# Patient Record
Sex: Female | Born: 1955 | Race: White | Hispanic: No | Marital: Single | Smoking: Never smoker
Health system: Southern US, Community
[De-identification: ages and names within clinical notes are randomized; demographics above are authoritative.]

## PROBLEM LIST (undated history)

## (undated) DIAGNOSIS — T7840XA Allergy, unspecified, initial encounter: Secondary | ICD-10-CM

## (undated) DIAGNOSIS — F419 Anxiety disorder, unspecified: Secondary | ICD-10-CM

## (undated) DIAGNOSIS — K5790 Diverticulosis of intestine, part unspecified, without perforation or abscess without bleeding: Secondary | ICD-10-CM

## (undated) DIAGNOSIS — G43019 Migraine without aura, intractable, without status migrainosus: Secondary | ICD-10-CM

## (undated) DIAGNOSIS — H9190 Unspecified hearing loss, unspecified ear: Secondary | ICD-10-CM

## (undated) DIAGNOSIS — K449 Diaphragmatic hernia without obstruction or gangrene: Secondary | ICD-10-CM

## (undated) DIAGNOSIS — G43909 Migraine, unspecified, not intractable, without status migrainosus: Secondary | ICD-10-CM

## (undated) DIAGNOSIS — E785 Hyperlipidemia, unspecified: Secondary | ICD-10-CM

## (undated) DIAGNOSIS — K219 Gastro-esophageal reflux disease without esophagitis: Secondary | ICD-10-CM

## (undated) DIAGNOSIS — H9313 Tinnitus, bilateral: Secondary | ICD-10-CM

## (undated) DIAGNOSIS — K648 Other hemorrhoids: Secondary | ICD-10-CM

## (undated) HISTORY — DX: Other hemorrhoids: K64.8

## (undated) HISTORY — DX: Diverticulosis of intestine, part unspecified, without perforation or abscess without bleeding: K57.90

## (undated) HISTORY — DX: Hyperlipidemia, unspecified: E78.5

## (undated) HISTORY — DX: Diaphragmatic hernia without obstruction or gangrene: K44.9

## (undated) HISTORY — PX: ADENOIDECTOMY: SUR15

## (undated) HISTORY — PX: COLONOSCOPY: SHX174

## (undated) HISTORY — DX: Anxiety disorder, unspecified: F41.9

## (undated) HISTORY — PX: TONSILLECTOMY AND ADENOIDECTOMY: SUR1326

## (undated) HISTORY — DX: Gastro-esophageal reflux disease without esophagitis: K21.9

## (undated) HISTORY — DX: Unspecified hearing loss, unspecified ear: H91.90

## (undated) HISTORY — DX: Migraine, unspecified, not intractable, without status migrainosus: G43.909

## (undated) HISTORY — DX: Migraine without aura, intractable, without status migrainosus: G43.019

## (undated) HISTORY — PX: UPPER GASTROINTESTINAL ENDOSCOPY: SHX188

## (undated) HISTORY — DX: Allergy, unspecified, initial encounter: T78.40XA

## (undated) HISTORY — DX: Tinnitus, bilateral: H93.13

---

## 1999-04-20 ENCOUNTER — Other Ambulatory Visit: Admission: RE | Admit: 1999-04-20 | Discharge: 1999-04-20 | Payer: Self-pay | Admitting: *Deleted

## 2000-01-01 ENCOUNTER — Encounter: Admission: RE | Admit: 2000-01-01 | Discharge: 2000-01-01 | Payer: Self-pay | Admitting: Family Medicine

## 2000-01-01 ENCOUNTER — Encounter: Payer: Self-pay | Admitting: Family Medicine

## 2000-05-03 ENCOUNTER — Other Ambulatory Visit: Admission: RE | Admit: 2000-05-03 | Discharge: 2000-05-03 | Payer: Self-pay | Admitting: *Deleted

## 2000-06-22 ENCOUNTER — Ambulatory Visit (HOSPITAL_COMMUNITY): Admission: RE | Admit: 2000-06-22 | Discharge: 2000-06-22 | Payer: Self-pay | Admitting: *Deleted

## 2001-04-04 ENCOUNTER — Encounter: Admission: RE | Admit: 2001-04-04 | Discharge: 2001-04-04 | Payer: Self-pay | Admitting: *Deleted

## 2001-05-08 ENCOUNTER — Other Ambulatory Visit: Admission: RE | Admit: 2001-05-08 | Discharge: 2001-05-08 | Payer: Self-pay | Admitting: *Deleted

## 2002-05-20 ENCOUNTER — Other Ambulatory Visit: Admission: RE | Admit: 2002-05-20 | Discharge: 2002-05-20 | Payer: Self-pay | Admitting: *Deleted

## 2003-05-27 ENCOUNTER — Other Ambulatory Visit: Admission: RE | Admit: 2003-05-27 | Discharge: 2003-05-27 | Payer: Self-pay | Admitting: *Deleted

## 2004-01-11 ENCOUNTER — Encounter: Admission: RE | Admit: 2004-01-11 | Discharge: 2004-01-11 | Payer: Self-pay | Admitting: Family Medicine

## 2004-01-19 ENCOUNTER — Encounter: Admission: RE | Admit: 2004-01-19 | Discharge: 2004-01-19 | Payer: Self-pay | Admitting: *Deleted

## 2005-02-16 ENCOUNTER — Encounter: Admission: RE | Admit: 2005-02-16 | Discharge: 2005-02-16 | Payer: Self-pay | Admitting: *Deleted

## 2006-03-12 ENCOUNTER — Encounter: Admission: RE | Admit: 2006-03-12 | Discharge: 2006-03-12 | Payer: Self-pay | Admitting: Family Medicine

## 2007-04-02 ENCOUNTER — Encounter: Admission: RE | Admit: 2007-04-02 | Discharge: 2007-04-02 | Payer: Self-pay | Admitting: Family Medicine

## 2008-05-07 ENCOUNTER — Encounter: Admission: RE | Admit: 2008-05-07 | Discharge: 2008-05-07 | Payer: Self-pay | Admitting: Family Medicine

## 2010-03-20 ENCOUNTER — Encounter: Admission: RE | Admit: 2010-03-20 | Discharge: 2010-03-20 | Payer: Self-pay | Admitting: Internal Medicine

## 2010-03-21 ENCOUNTER — Encounter: Admission: RE | Admit: 2010-03-21 | Discharge: 2010-03-21 | Payer: Self-pay | Admitting: Internal Medicine

## 2010-03-23 ENCOUNTER — Encounter (INDEPENDENT_AMBULATORY_CARE_PROVIDER_SITE_OTHER): Payer: Self-pay | Admitting: *Deleted

## 2010-03-29 ENCOUNTER — Encounter: Admission: RE | Admit: 2010-03-29 | Discharge: 2010-03-29 | Payer: Self-pay | Admitting: Family Medicine

## 2010-04-07 ENCOUNTER — Encounter (INDEPENDENT_AMBULATORY_CARE_PROVIDER_SITE_OTHER): Payer: Self-pay | Admitting: *Deleted

## 2010-04-12 ENCOUNTER — Ambulatory Visit: Payer: Self-pay | Admitting: Internal Medicine

## 2010-04-21 ENCOUNTER — Telehealth (INDEPENDENT_AMBULATORY_CARE_PROVIDER_SITE_OTHER): Payer: Self-pay | Admitting: *Deleted

## 2010-04-25 ENCOUNTER — Ambulatory Visit: Payer: Self-pay | Admitting: Internal Medicine

## 2010-12-12 NOTE — Miscellaneous (Signed)
Summary: [facility]  Clinical Lists Changes  Medications: Added new medication of MIRALAX   POWD (POLYETHYLENE GLYCOL 3350) As per prep  instructions. - Signed Added new medication of REGLAN 10 MG  TABS (METOCLOPRAMIDE HCL) As per prep instructions. - Signed Added new medication of DULCOLAX 5 MG  TBEC (BISACODYL) Day before procedure take 2 at 3pm and 2 at 8pm. - Signed Rx of MIRALAX   POWD (POLYETHYLENE GLYCOL 3350) As per prep  instructions.;  #255gm x 0;  Signed;  Entered by: Ezra Sites RN;  Authorized by: Hart Carwin MD;  Method used: Electronically to [facility] [phone]*, [address], [place], [place]  [address], Ph: [phone], Fax: [phone] Rx of REGLAN 10 MG  TABS (METOCLOPRAMIDE HCL) As per prep instructions.;  #2 x 0;  Signed;  Entered by: Ezra Sites RN;  Authorized by: Hart Carwin MD;  Method used: Electronically to [facility]. [phone]*, [address], [place], [place]  [address], Ph: [phone], Fax: [phone] Rx of DULCOLAX 5 MG  TBEC (BISACODYL) Day before procedure take 2 at 3pm and 2 at 8pm.;  #4 x 0;  Signed;  Entered by: Ezra Sites RN;  Authorized by: Hart Carwin MD;  Method used: Electronically to [facility]. [phone]*, [address] [place], [place]  [address], Ph: [phone], Fax: [phone] Allergies: Added new allergy or adverse reaction of SULFA Observations: Added new observation of NKA: F (04/12/2010 7:45)    Prescriptions: DULCOLAX 5 MG  TBEC (BISACODYL) Day before procedure take 2 at 3pm and 2 at 8pm.  #4 x 0   Entered by:   Ezra Sites RN   Authorized by:   Hart Carwin MD   Signed by:   Ezra Sites RN on 04/12/2010   Method used:   Electronically to        [facility]. [phone]* (retail)       [address]       [place], [place]  [address]       Ph: [phone]       Fax: [phone]   RxID:   [id number] REGLAN 10 MG  TABS (METOCLOPRAMIDE HCL) As per prep instructions.  #2 x 0   Entered by:   Ezra Sites  RN   Authorized by:   Hart Carwin MD   Signed by:   Ezra Sites RN on 04/12/2010   Method used:   Electronically to        [facility]. [phone]* (retail)       [address]       [place], [place]  [address]       Ph: [phone]       Fax: [phone]   RxID:   [id number] MIRALAX   POWD (POLYETHYLENE GLYCOL 3350) As per prep  instructions.  #255gm x 0   Entered by:   Ezra Sites RN   Authorized by:   Hart Carwin MD   Signed by:   Ezra Sites RN on 04/12/2010   Method used:   Electronically to        [facility]. [phone]* (retail)       [address]       [place], [place]  [address]       Ph: [phone]       Fax: [phone]   RxID:   [id number]

## 2010-12-12 NOTE — Progress Notes (Signed)
Summary: prep ?'s  Phone Note Call from Patient Call back at [phone]   Caller: Patient Call For: Dr. Juanda Chance Reason for Call: Talk to Nurse Summary of Call: prep ?'s Initial call taken by: Vallarie Mare,  April 21, 2010 11:49 AM  Follow-up for Phone Call        Called pt and left message we had returned call.Ulis Rias RN  April 21, 2010 1:06 PM phone call complete pt.'s question answered by Almyra Brace Follow-up by: Wyona Almas RN,  April 21, 2010 3:30 PM

## 2010-12-12 NOTE — Letter (Signed)
Summary: Previsit letter  [facility]  [address] [place], [place] [address]   Phone: [phone]  Fax: [phone]       03/23/2010 MRN: [id number]  Katherine Crosby [address] [place], [place]  [address]  Dear Ms. [name],  Welcome to the Gastroenterology Division at [facility].    You are scheduled to see a nurse for your pre-procedure visit on 04-12-10 at 8am on the 3rd floor at [facility], [address]  We ask that you try to arrive at our office 15 minutes prior to your appointment time to allow for check-in.  Your nurse visit will consist of discussing your medical and surgical history, your immediate family medical history, and your medications.    Please bring a complete list of all your medications or, if you prefer, bring the medication bottles and we will list them.  We will need to be aware of both prescribed and over the counter drugs.  We will need to know exact dosage information as well.  If you are on blood thinners (Coumadin, Plavix, Aggrenox, Ticlid, etc.) please call our office today/prior to your appointment, as we need to consult with your physician about holding your medication.   Please be prepared to read and sign documents such as consent forms, a financial agreement, and acknowledgement forms.  If necessary, and with your consent, a friend or relative is welcome to sit-in on the nurse visit with you.  Please bring your insurance card so that we may make a copy of it.  If your insurance requires a referral to see a specialist, please bring your referral form from your primary care physician.  No co-pay is required for this nurse visit.     If you cannot keep your appointment, please call [phone] to cancel or reschedule prior to your appointment date.  This allows [place] the opportunity to schedule an appointment for another patient in need of care.    Thank you for choosing [facility] for your medical needs.  We  appreciate the opportunity to care for you.  Please visit [place] at our website  to learn more about our practice.                     Sincerely.                                                                                                                   The Gastroenterology Division

## 2010-12-12 NOTE — Letter (Signed)
Summary: [facility] Instructions  [facility]  [address] [place], [place] [address]   Phone: [phone]  Fax: [phone]       Katherine Crosby    [birth year]    MRN: [id number]       Procedure Day /Date: Tuesday 04/25/2010     Arrival Time:  9:00 am     Procedure Time: 10:00 am     Location of Procedure:                    _x _  [facility] (4th Floor)    PREPARATION FOR COLONOSCOPY WITH MIRALAX  Starting 5 days prior to your procedure Thursday 6/9 do not eat nuts, seeds, popcorn, corn, beans, peas,  salads, or any raw vegetables.  Do not take any fiber supplements (e.g. Metamucil, Citrucel, and Benefiber). ____________________________________________________________________________________________________   THE DAY BEFORE YOUR PROCEDURE         DATE: Monday 6/13  1   Drink clear liquids the entire day-NO SOLID FOOD  2   Do not drink anything colored red or purple.  Avoid juices with pulp.  No orange juice.  3   Drink at least 64 oz. (8 glasses) of fluid/clear liquids during the day to prevent dehydration and help the prep Mordecai efficiently.  CLEAR LIQUIDS INCLUDE: Water Jello Ice Popsicles Tea (sugar ok, no milk/cream) Powdered fruit flavored drinks Coffee (sugar ok, no milk/cream) Gatorade Juice: apple, white grape, white cranberry  Lemonade Clear bullion, consomm, broth Carbonated beverages (any kind) Strained chicken noodle soup Hard Candy  4   Mix the entire bottle of Miralax with 64 oz. of Gatorade/Powerade in the morning and put in the refrigerator to chill.  5   At 3:00 pm take 2 Dulcolax/Bisacodyl tablets.  6   At 4:30 pm take one Reglan/Metoclopramide tablet.  7  Starting at 5:00 pm drink one 8 oz glass of the Miralax mixture every 15-20 minutes until you have finished drinking the entire 64 oz.  You should finish drinking prep around 7:30 or 8:00 pm.  8   If you are nauseated, you may take the 2nd Reglan/Metoclopramide  tablet at 6:30 pm.        9    At 8:00 pm take 2 more DULCOLAX/Bisacodyl tablets.     THE DAY OF YOUR PROCEDURE      DATE:  Tuesday 6/14  You may drink clear liquids until 8:00 am  (2 HOURS BEFORE PROCEDURE).   MEDICATION INSTRUCTIONS  Unless otherwise instructed, you should take regular prescription medications with a small sip of water as early as possible the morning of your procedure.          OTHER INSTRUCTIONS  You will need a responsible adult at least 53 years of age to accompany you and drive you home.   This person must remain in the waiting room during your procedure.  Wear loose fitting clothing that is easily removed.  Leave jewelry and other valuables at home.  However, you may wish to bring a book to read or an iPod/MP3 player to listen to music as you wait for your procedure to start.  Remove all body piercing jewelry and leave at home.  Total time from sign-in until discharge is approximately 2-3 hours.  You should go home directly after your procedure and rest.  You can resume normal activities the day after your procedure.  The day of your procedure you should not:   Drive  Make legal decisions   Operate machinery   Drink alcohol   Return to [place]  You will receive specific instructions about eating, activities and medications before you leave.   The above instructions have been reviewed and explained to me by   Ezra Sites RN  April 12, 2010 8:05 AM     I fully understand and can verbalize these instructions _____________________________ Date _______

## 2010-12-12 NOTE — Procedures (Signed)
Summary: Colonoscopy  Patient: Yvonne Diefenderfer Note: All result statuses are Final unless otherwise noted.  Tests: (1) Colonoscopy (COL)   COL Colonoscopy           DONE     [facility]     [address]     [place], [place]  [address]           COLONOSCOPY PROCEDURE REPORT           PATIENT:  Rohm, Reighlynn  MR#:  [id number]     BIRTHDATE:  Jul 22, 1956, 53 yrs. old  GENDER:  female     ENDOSCOPIST:  Hedwig Morton. Juanda Chance, MD     REF. BY:  Merri Brunette, M.D.     PROCEDURE DATE:  04/25/2010     PROCEDURE:  Colonoscopy [address]     ASA CLASS:  Class I     INDICATIONS:  Routine Risk Screening     MEDICATIONS:   Versed 7 mg, Fentanyl 75 mcg           DESCRIPTION OF PROCEDURE:   After the risks benefits and     alternatives of the procedure were thoroughly explained, informed     consent was obtained.  Digital rectal exam was performed and     revealed no rectal masses.   The LB [id number]-[id number] endoscope     was introduced through the anus and advanced to the cecum, which     was identified by both the appendix and ileocecal valve, without     limitations.  The quality of the prep was good, using MiraLax.     The instrument was then slowly withdrawn as the colon was fully     examined.     <<PROCEDUREIMAGES>>           FINDINGS:  No polyps or cancers were seen (see image1, image2,     image3, and image4).   Retroflexed views in the rectum revealed no     abnormalities.    The scope was then withdrawn from the patient     and the procedure completed.           COMPLICATIONS:  None     ENDOSCOPIC IMPRESSION:     1) No polyps or cancers     2) Normal colonoscopy     RECOMMENDATIONS:     1) high fiber diet     REPEAT EXAM:  In 10 year(s) for.           ______________________________     Hedwig Morton. Juanda Chance, MD           CC:           n.     eSIGNED:   Hedwig Morton. Starlee Corralejo at 04/25/2010 10:29 AM           Ripple, Elease Hashimoto, [phone]  Note: An exclamation mark (!) indicates a result  that was not dispersed into the flowsheet. Document Creation Date: 04/25/2010 10:30 AM _______________________________________________________________________  (1) Order result status: Final Collection or observation date-time: 04/25/2010 10:24 Requested date-time:  Receipt date-time:  Reported date-time:  Referring Physician:   Ordering Physician: Lina Sar [phone]) Specimen Source:  Source: Launa Grill Order Number: [id number] Lab site:   Appended Document: Colonoscopy    Clinical Lists Changes  Observations: Added new observation of COLONNXTDUE: 04/2020 (04/25/2010 12:53)

## 2012-06-12 ENCOUNTER — Other Ambulatory Visit: Payer: Self-pay | Admitting: Obstetrics and Gynecology

## 2012-06-12 DIAGNOSIS — Z1231 Encounter for screening mammogram for malignant neoplasm of breast: Secondary | ICD-10-CM

## 2012-06-26 ENCOUNTER — Ambulatory Visit
Admission: RE | Admit: 2012-06-26 | Discharge: 2012-06-26 | Disposition: A | Discharge status: Home / Self Care | Payer: BC Managed Care – PPO | Source: Ambulatory Visit | Attending: Obstetrics and Gynecology | Admitting: Obstetrics and Gynecology

## 2012-06-26 DIAGNOSIS — Z1231 Encounter for screening mammogram for malignant neoplasm of breast: Secondary | ICD-10-CM

## 2012-12-16 ENCOUNTER — Ambulatory Visit
Admission: RE | Admit: 2012-12-16 | Discharge: 2012-12-16 | Disposition: A | Discharge status: Home / Self Care | Payer: BC Managed Care – PPO | Source: Ambulatory Visit | Attending: Family Medicine | Admitting: Family Medicine

## 2012-12-16 ENCOUNTER — Other Ambulatory Visit: Payer: Self-pay | Admitting: Family Medicine

## 2012-12-16 DIAGNOSIS — S63509A Unspecified sprain of unspecified wrist, initial encounter: Secondary | ICD-10-CM

## 2012-12-16 DIAGNOSIS — W19XXXA Unspecified fall, initial encounter: Secondary | ICD-10-CM

## 2012-12-16 DIAGNOSIS — S93409A Sprain of unspecified ligament of unspecified ankle, initial encounter: Secondary | ICD-10-CM

## 2013-07-24 ENCOUNTER — Other Ambulatory Visit: Payer: Self-pay

## 2013-07-24 DIAGNOSIS — Z1231 Encounter for screening mammogram for malignant neoplasm of breast: Secondary | ICD-10-CM

## 2013-07-27 ENCOUNTER — Ambulatory Visit: Payer: BC Managed Care – PPO

## 2013-08-07 ENCOUNTER — Ambulatory Visit
Admission: RE | Admit: 2013-08-07 | Discharge: 2013-08-07 | Disposition: A | Discharge status: Home / Self Care | Payer: BC Managed Care – PPO | Source: Ambulatory Visit

## 2013-08-07 DIAGNOSIS — Z1231 Encounter for screening mammogram for malignant neoplasm of breast: Secondary | ICD-10-CM

## 2014-07-26 ENCOUNTER — Other Ambulatory Visit: Payer: Self-pay

## 2014-07-26 DIAGNOSIS — Z1231 Encounter for screening mammogram for malignant neoplasm of breast: Secondary | ICD-10-CM

## 2014-08-09 ENCOUNTER — Ambulatory Visit
Admission: RE | Admit: 2014-08-09 | Discharge: 2014-08-09 | Disposition: A | Discharge status: Home / Self Care | Payer: BC Managed Care – PPO | Source: Ambulatory Visit

## 2014-08-09 DIAGNOSIS — Z1231 Encounter for screening mammogram for malignant neoplasm of breast: Secondary | ICD-10-CM

## 2015-03-17 ENCOUNTER — Encounter: Payer: Self-pay | Admitting: Internal Medicine

## 2015-11-21 ENCOUNTER — Other Ambulatory Visit: Payer: Self-pay

## 2015-11-21 DIAGNOSIS — Z1231 Encounter for screening mammogram for malignant neoplasm of breast: Secondary | ICD-10-CM

## 2015-12-08 ENCOUNTER — Ambulatory Visit: Payer: BC Managed Care – PPO

## 2015-12-19 ENCOUNTER — Ambulatory Visit
Admission: RE | Admit: 2015-12-19 | Discharge: 2015-12-19 | Disposition: A | Discharge status: Home / Self Care | Payer: BC Managed Care – PPO | Source: Ambulatory Visit

## 2015-12-19 DIAGNOSIS — Z1231 Encounter for screening mammogram for malignant neoplasm of breast: Secondary | ICD-10-CM

## 2017-03-25 ENCOUNTER — Other Ambulatory Visit: Payer: Self-pay | Admitting: Family Medicine

## 2017-03-25 DIAGNOSIS — Z1231 Encounter for screening mammogram for malignant neoplasm of breast: Secondary | ICD-10-CM

## 2017-03-26 ENCOUNTER — Encounter: Payer: Self-pay | Admitting: Gastroenterology

## 2017-04-03 ENCOUNTER — Ambulatory Visit
Admission: RE | Admit: 2017-04-03 | Discharge: 2017-04-03 | Disposition: A | Discharge status: Home / Self Care | Payer: BC Managed Care – PPO | Source: Ambulatory Visit | Attending: Family Medicine | Admitting: Family Medicine

## 2017-04-03 DIAGNOSIS — Z1231 Encounter for screening mammogram for malignant neoplasm of breast: Secondary | ICD-10-CM

## 2017-04-30 ENCOUNTER — Encounter: Payer: Self-pay | Admitting: Gastroenterology

## 2017-04-30 ENCOUNTER — Ambulatory Visit (INDEPENDENT_AMBULATORY_CARE_PROVIDER_SITE_OTHER): Payer: BC Managed Care – PPO | Admitting: Gastroenterology

## 2017-04-30 VITALS — BP 140/88 | HR 76 | Ht 67.0 in | Wt 182.6 lb

## 2017-04-30 DIAGNOSIS — K219 Gastro-esophageal reflux disease without esophagitis: Secondary | ICD-10-CM | POA: Diagnosis not present

## 2017-04-30 DIAGNOSIS — R1310 Dysphagia, unspecified: Secondary | ICD-10-CM | POA: Diagnosis not present

## 2017-04-30 NOTE — Progress Notes (Signed)
HPI :  61 y/o female with a history of migraine headaches and reflux, here for a new patient evaluation for reflux.   She reports she has some burning chest pressure, and belching which can bother her. Symptoms are intermittent.  No clear food precipitants, or predictable triggers related to these symptoms. She reports longstanding reflux symptoms for several years. She previously used Zantac OTC periodically which helped, but then symptoms started to progress. She then began omeprazole low dose 20mg  using it PRN which worked initially, and now taking it daily at 40mg  once daily. She has been on 40mg  once per day for a few months. She is having breakthrough despite this, hard to clarify how often. She can feel symptoms both during daytime and at night. She has a sense of periodic dysphagia, about 1/4 of the time she is eating. No liquid dysphagia. She has some occasional nausea, no vomiting. She has some occasional RLQ pain which she states is chronic, and taken away with dietary changes, eating high fiber diet. She thinks maybe has gained a few pounds, but nothing dramatic.   No FH of esophageal or colon cancer.   Colonoscopy 04/25/2010 - normal  Past Medical History:  Diagnosis Date  . GERD (gastroesophageal reflux disease)   . Migraine headache      Past Surgical History:  Procedure Laterality Date  . TONSILLECTOMY AND ADENOIDECTOMY       Family History  Problem Relation Age of Onset  . Stroke Mother   . Dementia Father    Social History  Substance Use Topics  . Smoking status: Never Smoker  . Smokeless tobacco: Never Used  . Alcohol use Yes   Current Outpatient Prescriptions  Medication Sig Dispense Refill  . calcium carbonate (OS-CAL) 600 MG TABS tablet Take 1 tablet by mouth daily.    . cholecalciferol (VITAMIN D) 1000 units tablet Take 1 tablet by mouth daily.    . Omega-3 Fatty Acids (FISH OIL OMEGA-3 PO) Take 1 capsule by mouth daily.    Marland Kitchen omeprazole (PRILOSEC) 40 MG  capsule Take 1 capsule by mouth daily.  5  . rizatriptan (MAXALT-MLT) 10 MG disintegrating tablet Take 10 mg by mouth as needed.     No current facility-administered medications for this visit.    Allergies  Allergen Reactions  . Metronidazole Nausea And Vomiting  . Cefuroxime Axetil Nausea And Vomiting  . Sulfonamide Derivatives     REACTION: rash     Review of Systems: All systems reviewed and negative except where noted in HPI.    Mm Screening Breast Tomo Bilateral  Result Date: 04/03/2017 CLINICAL DATA:  Screening. EXAM: 2D DIGITAL SCREENING BILATERAL MAMMOGRAM WITH CAD AND ADJUNCT TOMO COMPARISON:  Previous exam(s). ACR Breast Density Category b: There are scattered areas of fibroglandular density. FINDINGS: There are no findings suspicious for malignancy. Images were processed with CAD. IMPRESSION: No mammographic evidence of malignancy. A result letter of this screening mammogram will be mailed directly to the patient. RECOMMENDATION: Screening mammogram in one year. (Code:SM-B-01Y) BI-RADS CATEGORY  1: Negative. Electronically Signed   By: Evangeline Dakin M.D.   On: 04/03/2017 14:48   No results found for: WBC, HGB, HCT, MCV, PLT  No results found for: CREATININE, BUN, NA, K, CL, CO2  No results found for: ALT, AST, GGT, ALKPHOS, BILITOT    Physical Exam: BP 140/88   Pulse 76   Ht 5\' 7"  (1.702 m)   Wt 182 lb 9.6 oz (82.8 kg)   BMI  28.60 kg/m  Constitutional: Pleasant,well-developed, female in no acute distress. HEENT: Normocephalic and atraumatic. Conjunctivae are normal. No scleral icterus. Neck supple.  Cardiovascular: Normal rate, regular rhythm.  Pulmonary/chest: Effort normal and breath sounds normal. No wheezing, rales or rhonchi. Abdominal: Soft, nondistended, nontender.  There are no masses palpable. No hepatomegaly. Extremities: no edema Lymphadenopathy: No cervical adenopathy noted. Neurological: Alert and oriented to person place and time. Skin: Skin  is warm and dry. No rashes noted. Psychiatric: Normal mood and affect. Behavior is normal.   ASSESSMENT AND PLAN: 61 year old female with history as above here for new patient evaluation for reflux.  Long-standing symptoms, initially well controlled with Zantac now with escalating doses of PPI, with continued breakthrough of reflux symptoms. We discussed natural history of reflux, potential complications to include Barrett's esophagus and peptic stricture. She's having some periodic dysphagia which is relatively new. Recommend an upper endoscopy to further evaluate and potentially perform dilation for symptoms of dysphagia. I discussed risks and benefits of endoscopy and anesthesia with her and she wished to proceed. Given her breakthrough reflux symptoms that are bothering her currently, recommend increasing omeprazole to 40 mg twice daily until the procedure. We did discuss long-term potential risks of PPIs - recommend low-dose daily dose needed to control symptoms long-term, for now she will continue high-dose given she returns relatively symptomatic.  All questions answered.  Phelps Cellar, MD [facility] Pager [phone]

## 2017-04-30 NOTE — Patient Instructions (Signed)
If you are age 59 or older, your body mass index should be between 23-30. Your Body mass index is 28.6 kg/m. If this is out of the aforementioned range listed, please consider follow up with your Primary Care Provider.  If you are age 59 or younger, your body mass index should be between 19-25. Your Body mass index is 28.6 kg/m. If this is out of the aformentioned range listed, please consider follow up with your Primary Care Provider.   You have been scheduled for an endoscopy. Please follow written instructions given to you at your visit today. If you use inhalers (even only as needed), please bring them with you on the day of your procedure. Your physician has requested that you go to www.startemmi.com and enter the access code given to you at your visit today. This web site gives a general overview about your procedure. However, you should still follow specific instructions given to you by our office regarding your preparation for the procedure.  Thank you.

## 2017-05-01 ENCOUNTER — Encounter: Payer: Self-pay | Admitting: Gastroenterology

## 2017-05-12 DIAGNOSIS — S82401A Unspecified fracture of shaft of right fibula, initial encounter for closed fracture: Secondary | ICD-10-CM

## 2017-05-12 HISTORY — DX: Unspecified fracture of shaft of right fibula, initial encounter for closed fracture: S82.401A

## 2017-05-27 ENCOUNTER — Telehealth: Payer: Self-pay | Admitting: Gastroenterology

## 2017-05-27 NOTE — Telephone Encounter (Signed)
Called patient to let her know that it should be okay to proceed with EGD on 7/27. I did let her know that if she worsens and will need more assistance then to call and we can reschedule.

## 2017-05-27 NOTE — Telephone Encounter (Signed)
Patient broke her right lower leg, is in a walking boot. She also sprained left lower leg. Currently she is in a wheelchair, is able with assistance to pivot transfer. She is scheduled for EGD on 7/27 at [facility] and wants to know if this will interfere. Please advise.

## 2017-05-27 NOTE — Telephone Encounter (Signed)
If she can bear weight on it to pivot and only minimal assistance I think okay to proceed. Hopefully she is improved a bit by 7/27. If she is completely dependant on others assistance to move then we would have to reschedule. Thanks

## 2017-05-28 ENCOUNTER — Encounter: Payer: Self-pay | Admitting: Gastroenterology

## 2017-06-05 HISTORY — PX: OTHER SURGICAL HISTORY: SHX169

## 2017-06-07 ENCOUNTER — Encounter: Payer: BC Managed Care – PPO | Admitting: Gastroenterology

## 2017-07-11 ENCOUNTER — Encounter: Payer: Self-pay | Admitting: Gastroenterology

## 2017-07-11 ENCOUNTER — Ambulatory Visit (AMBULATORY_SURGERY_CENTER): Payer: BC Managed Care – PPO | Admitting: Gastroenterology

## 2017-07-11 VITALS — BP 138/68 | HR 66 | Temp 98.6°F | Resp 15 | Ht 67.0 in | Wt 182.0 lb

## 2017-07-11 DIAGNOSIS — K259 Gastric ulcer, unspecified as acute or chronic, without hemorrhage or perforation: Secondary | ICD-10-CM | POA: Diagnosis not present

## 2017-07-11 DIAGNOSIS — K200 Eosinophilic esophagitis: Secondary | ICD-10-CM

## 2017-07-11 DIAGNOSIS — R1310 Dysphagia, unspecified: Secondary | ICD-10-CM | POA: Diagnosis not present

## 2017-07-11 MED ORDER — SODIUM CHLORIDE 0.9 % IV SOLN: 500.0000 mL | INTRAVENOUS | Status: DC

## 2017-07-11 MED ORDER — OMEPRAZOLE 40 MG PO CPDR: 40.0000 mg | DELAYED_RELEASE_CAPSULE | Freq: Two times a day (BID) | ORAL | 5 refills | Status: DC

## 2017-07-11 MED ORDER — OMEPRAZOLE 40 MG PO CPDR: 40.0000 mg | DELAYED_RELEASE_CAPSULE | Freq: Every day | ORAL | 3 refills | Status: DC

## 2017-07-11 NOTE — Progress Notes (Signed)
Called to room to assist during endoscopic procedure.  Patient ID and intended procedure confirmed with present staff. Received instructions for my participation in the procedure from the performing physician.  

## 2017-07-11 NOTE — Patient Instructions (Signed)
YOU HAD AN ENDOSCOPIC PROCEDURE TODAY AT [facility]:   Refer to the procedure report that was given to you for any specific questions about what was found during the examination.  If the procedure report does not answer your questions, please call your gastroenterologist to clarify.  If you requested that your care partner not be given the details of your procedure findings, then the procedure report has been included in a sealed envelope for you to review at your convenience later.  YOU SHOULD EXPECT: Some feelings of bloating in the abdomen. Passage of more gas than usual.  Walking can help get rid of the air that was put into your GI tract during the procedure and reduce the bloating. If you had a lower endoscopy (such as a colonoscopy or flexible sigmoidoscopy) you may notice spotting of blood in your stool or on the toilet paper. If you underwent a bowel prep for your procedure, you may not have a normal bowel movement for a few days.  Please Note:  You might notice some irritation and congestion in your nose or some drainage.  This is from the oxygen used during your procedure.  There is no need for concern and it should clear up in a day or so.  SYMPTOMS TO REPORT IMMEDIATELY:   Following upper endoscopy (EGD)  Vomiting of blood or coffee ground material  New chest pain or pain under the shoulder blades  Painful or persistently difficult swallowing  New shortness of breath  Fever of 100F or higher  Black, tarry-looking stools  For urgent or emergent issues, a gastroenterologist can be reached at any hour by calling [phone].   DIET: Follow Post Esophageal Dilatation Diet as ordered.  Drink plenty of fluids but you should avoid alcoholic beverages for 24 hours.  ACTIVITY:  You should plan to take it easy for the rest of today and you should NOT DRIVE or use heavy machinery until tomorrow (because of the sedation medicines used during the test).    FOLLOW  UP: Our staff will call the number listed on your records the next business day following your procedure to check on you and address any questions or concerns that you may have regarding the information given to you following your procedure. If we do not reach you, we will leave a message.  However, if you are feeling well and you are not experiencing any problems, there is no need to return our call.  We will assume that you have returned to your regular daily activities without incident.  If any biopsies were taken you will be contacted by phone or by letter within the next 1-3 weeks.  Please call us at [phone] if you have not heard about the biopsies in 3 weeks.   Post Esophageal Dilation Diet (handout given) Esophagitis and Stricture (handout given) Await for biopsy results   SIGNATURES/CONFIDENTIALITY: You and/or your care partner have signed paperwork which will be entered into your electronic medical record.  These signatures attest to the fact that that the information above on your After Visit Summary has been reviewed and is understood.  Full responsibility of the confidentiality of this discharge information lies with you and/or your care-partner.

## 2017-07-11 NOTE — Progress Notes (Signed)
To PACU, VSS. Report to [email or web address] 

## 2017-07-11 NOTE — Op Note (Signed)
[facility] Patient Name: Katherine Crosby Procedure Date: 07/11/2017 10:57 AM MRN: [id number] Endoscopist: Remo Lipps P. Armbruster MD, MD Age: 61 Referring MD:  Date of Birth: 04-24-1956 Gender: Female Account #: [id number] Procedure:                Upper GI endoscopy Indications:              Dysphagia, Heartburn despite omeprazole 20mg  twice                            daily Medicines:                Monitored Anesthesia Care Procedure:                Pre-Anesthesia Assessment:                           - Prior to the procedure, a History and Physical                            was performed, and patient medications and                            allergies were reviewed. The patient's tolerance of                            previous anesthesia was also reviewed. The risks                            and benefits of the procedure and the sedation                            options and risks were discussed with the patient.                            All questions were answered, and informed consent                            was obtained. Prior Anticoagulants: The patient has                            taken no previous anticoagulant or antiplatelet                            agents. ASA Grade Assessment: II - A patient with                            mild systemic disease. After reviewing the risks                            and benefits, the patient was deemed in                            satisfactory condition to undergo the procedure.  After obtaining informed consent, the endoscope was                            passed under direct vision. Throughout the                            procedure, the patient's blood pressure, pulse, and                            oxygen saturations were monitored continuously. The                            Model GIF-HQ190 [id number]) scope was introduced                            through the mouth, and advanced to the  second part                            of duodenum. The upper GI endoscopy was                            accomplished without difficulty. The patient                            tolerated the procedure well. Scope In: Scope Out: Findings:                 Esophagogastric landmarks were identified: the                            Z-line was found at 34 cm, the gastroesophageal                            junction was found at 34 cm and the upper extent of                            the gastric folds was found at 36 cm from the                            incisors.                           A 2 cm hiatal hernia was present.                           The exam of the esophagus was otherwise normal. No                            obvious inflammatory changes noted.                           A guidewire was placed and the scope was withdrawn.                            Empiric  dilation was performed in the entire                            esophagus with a Savary dilator with mild                            resistance at 17 mm and 18 mm. Relook endoscopy                            showed a small appropriate mucosal wrent at the UES                            / upper esophagus. Biopsies were taken with a cold                            forceps in the upper third of the esophagus, in the                            middle third of the esophagus and in the lower                            third of the esophagus for histology to rule out                            eosinophilic esophagitis.                           Patchy inflammation characterized by adherent old                            blood was found in the gastric antrum. Biopsies                            were taken from the antrum, incisura, and body with                            a cold forceps for Helicobacter pylori testing.                           The exam of the stomach was otherwise normal.                           The duodenal bulb  and second portion of the                            duodenum were normal. Complications:            No immediate complications. Estimated blood loss:                            Minimal. Estimated Blood Loss:     Estimated blood loss was minimal. Impression:               - Esophagogastric landmarks identified.                           -  2 cm hiatal hernia.                           - Normal esophagus otherwise - empirically dilated                            to 33mm with small wrent at UES / upper esophagus,                            suspect a subtle stenosis was in this area. Also                            took biopsies of the esophagus to rule out                            eosinophilic esophagitis.                           - Gastritis. Biopsied.                           - Normal duodenal bulb and second portion of the                            duodenum. Recommendation:           - Patient has a contact number available for                            emergencies. The signs and symptoms of potential                            delayed complications were discussed with the                            patient. Return to normal activities tomorrow.                            Written discharge instructions were provided to the                            patient.                           - Resume previous diet.                           - Continue present medications.                           - Increase omeprazole to 40mg  twice daily - take                            1/2 hr prior to a meal                           -  Await pathology results and course following                            dilation / increasing omeprazole Remo Lipps P. Armbruster MD, MD 07/11/2017 11:22:15 AM This report has been signed electronically.

## 2017-07-12 ENCOUNTER — Telehealth: Payer: Self-pay | Admitting: *Deleted

## 2017-07-12 NOTE — Telephone Encounter (Signed)
  Follow up Call-  Call back number 07/11/2017  Post procedure Call Back phone  # [phone]  Permission to leave phone message Yes  Some recent data might be hidden     Patient questions:  Do you have a fever, pain , or abdominal swelling? No. Pain Score  0 *  Have you tolerated food without any problems? Yes.    Have you been able to return to your normal activities? Yes.    Do you have any questions about your discharge instructions: Diet   No. Medications  No. Follow up visit  No.  Do you have questions or concerns about your Care? No.  Actions: * If pain score is 4 or above: No action needed, pain <4.

## 2017-07-18 ENCOUNTER — Encounter: Payer: Self-pay | Admitting: Gastroenterology

## 2018-03-04 ENCOUNTER — Ambulatory Visit: Payer: BC Managed Care – PPO | Admitting: Physician Assistant

## 2018-03-12 ENCOUNTER — Ambulatory Visit: Payer: BC Managed Care – PPO | Admitting: Nurse Practitioner

## 2018-03-12 ENCOUNTER — Encounter: Payer: Self-pay | Admitting: Nurse Practitioner

## 2018-03-12 VITALS — BP 140/80 | HR 78 | Ht 67.0 in | Wt 179.2 lb

## 2018-03-12 DIAGNOSIS — K219 Gastro-esophageal reflux disease without esophagitis: Secondary | ICD-10-CM | POA: Diagnosis not present

## 2018-03-12 DIAGNOSIS — R1310 Dysphagia, unspecified: Secondary | ICD-10-CM | POA: Diagnosis not present

## 2018-03-12 MED ORDER — OMEPRAZOLE 40 MG PO CPDR: 40.0000 mg | DELAYED_RELEASE_CAPSULE | ORAL | 6 refills | Status: DC

## 2018-03-12 MED ORDER — RANITIDINE HCL 300 MG PO TABS: 300.0000 mg | ORAL_TABLET | Freq: Every day | ORAL | 3 refills | Status: DC

## 2018-03-12 NOTE — Patient Instructions (Signed)
If you are age 60 or older, your body mass index should be between 23-30. Your Body mass index is 28.07 kg/m. If this is out of the aforementioned range listed, please consider follow up with your Primary Care Provider.  If you are age 21 or younger, your body mass index should be between 19-25. Your Body mass index is 28.07 kg/m. If this is out of the aformentioned range listed, please consider follow up with your Primary Care Provider.   We have sent the following medications to your pharmacy for you to pick up at your convenience: Omeprazole Zantac  Advised patient to eat small bites, chew well with liquids in between bites to avoid food impaction.  You have been given GERD literature.  Consider using a wedge pillow.  Call in 3-4 weeks with an update.  Thank you for choosing me and [facility].   Tye Savoy, NP

## 2018-03-12 NOTE — Progress Notes (Signed)
      IMPRESSION and PLAN:    #1. 62 year female with GERD / recurrent dysphagia.  Less than one year relief after empirical esophageal dilation last August -Anti-reflux measures discussed, recommended wedge pillow -Resume Omeprazole 40 mg q am 30 min prior to breakfast -Add zantac 300 mg at HS -call me in 4 weeks with update.  -No episodes of dysphagia in a couple of weeks. If symptoms recur then consider esophageal manometry as repeat empirical dilation probable wouldn't be helpful .   #2. Recent intermittent epigastric pain, non-radiating and often post-prandial. Pain now resolved      #3. Colon cancer screening, she is up to date on colonoscopy. Last one 2011.   HPI:    Chief Complaint:  GERD / dysphagia.    Patient is a 72 female known to Dr. Havery Moros for GERD. She underwent EGD with empirical dilation last August.and subsequently had resolution of GERD sx and dysphagia. In January she made some dietary changes, reduced fat intake and sx came back triple fold. Restarted Omeprazole but sx seemed to be worse on  days she took it. Tried Vinegar but it didn't help. The dysphagia is to solids only, but she hasn't had any problems swallowing in a couple of weeks. Her throat has been sore but she has a lot of sinus drainage and attributes it to allergies. No odynophagia. She just started Claritin yesterday. She still gets heartburn several times a week. She was recently having intermittent non-radiating epigastric discomfort , worse with meals. No pain in two weeks now. She has no associated nausea / vomiting. No NSAID use. BMs normal. She is slowly losing weight intentionally    Review of systems:     No chest pain, SOB or urinary sx  Past Medical History:  Diagnosis Date  . Allergy   . GERD (gastroesophageal reflux disease)   . Hyperlipidemia   . Migraine headache     Patient's surgical history, family medical history, social history, medications and allergies were all reviewed  in Epic    Physical Exam:     BP 140/80   Pulse 78   Ht 5\' 7"  (1.702 m)   Wt 179 lb 3.2 oz (81.3 kg)   SpO2 96%   BMI 28.07 kg/m   GENERAL: well developed white female in in NAD PSYCH: :Pleasant, cooperative, normal affect EENT:  conjunctiva pink, mucous membranes moist, neck supple without masses CARDIAC:  RRR, no murmur heard, no peripheral edema PULM: Normal respiratory effort, lungs CTA bilaterally, no wheezing ABDOMEN:  Nondistended, soft, nontender. No obvious masses, no hepatomegaly,  normal bowel sounds SKIN:  turgor, no lesions seen Musculoskeletal:  Normal muscle tone, normal strength NEURO: Alert and oriented x 3, no focal neurologic deficits   Tye Savoy , NP 03/12/2018, 8:51 AM

## 2018-03-13 ENCOUNTER — Ambulatory Visit: Payer: BC Managed Care – PPO | Admitting: Physician Assistant

## 2018-03-16 ENCOUNTER — Encounter: Payer: Self-pay | Admitting: Nurse Practitioner

## 2018-03-17 NOTE — Progress Notes (Signed)
Agree with assessment and plan as outlined.  

## 2018-04-14 ENCOUNTER — Other Ambulatory Visit: Payer: Self-pay | Admitting: Family Medicine

## 2018-04-14 DIAGNOSIS — Z1231 Encounter for screening mammogram for malignant neoplasm of breast: Secondary | ICD-10-CM

## 2018-04-22 ENCOUNTER — Ambulatory Visit: Payer: BC Managed Care – PPO | Admitting: Neurology

## 2018-04-22 ENCOUNTER — Encounter: Payer: Self-pay | Admitting: Neurology

## 2018-04-22 ENCOUNTER — Telehealth: Payer: Self-pay | Admitting: Neurology

## 2018-04-22 VITALS — BP 124/70 | HR 68 | Ht 66.0 in | Wt 176.0 lb

## 2018-04-22 DIAGNOSIS — G43019 Migraine without aura, intractable, without status migrainosus: Secondary | ICD-10-CM | POA: Diagnosis not present

## 2018-04-22 DIAGNOSIS — R42 Dizziness and giddiness: Secondary | ICD-10-CM

## 2018-04-22 DIAGNOSIS — H81399 Other peripheral vertigo, unspecified ear: Secondary | ICD-10-CM

## 2018-04-22 HISTORY — DX: Migraine without aura, intractable, without status migrainosus: G43.019

## 2018-04-22 MED ORDER — TOPIRAMATE 25 MG PO TABS: ORAL_TABLET | ORAL | 3 refills | Status: DC

## 2018-04-22 NOTE — Progress Notes (Signed)
Reason for visit: Vertigo, headache  Referring physician: Dr. Elisabeth Crosby E Crosby is a 62 y.o. female  History of present illness:  [name] is a 62 year old right-handed white female with a history of migraine headaches.  The patient is having 2 or 3 headaches a week at this point, the headaches are becoming more frequent as time goes on.  The headaches usually not severe, but are on the left vertex of the head.  The headaches may be associated with some nausea and vomiting if they are severe, she may have some blurring of vision.  She denies a lot of photophobia or phonophobia with the headache.  She does report some chronic neck discomfort and neck stiffness.  Over the last year she has had a new problem with episodes of vertigo that are usually associated with a rocking sensation, not true spinning.  The episodes tend to occur when she looks up or when she lies down in bed.  The episodes will last only about 15 or 20 seconds and then clear up.  She does have some bilateral tinnitus, some slight hearing problems.  She has undergone some vestibular rehabilitation without benefit.  She has not had MRI or CT scan evaluation of the brain.  She reports no focal numbness or weakness of the face, arms, legs.  She does report a concurrent gradual worsening of her balance, she has fallen on 2 occasions, she has fractured her right ankle in July 2018.  She does not correlate the falls with the sensation of vertigo.  The patient will stagger from one side to the next with walking down the hall.  She reports no episodes of syncope.  She claims that her mother and her sister also have migraine headache.  She is sent to this office for an evaluation.  Past Medical History:  Diagnosis Date  . Allergy   . Common migraine with intractable migraine 04/22/2018  . GERD (gastroesophageal reflux disease)   . Hyperlipidemia   . Migraine headache     Past Surgical History:  Procedure Laterality Date  .  right ankle surgery w/plate and screws  60/48/5462  . TONSILLECTOMY AND ADENOIDECTOMY      Family History  Problem Relation Age of Onset  . Stroke Mother   . Migraines Mother   . Dementia Father   . Migraines Sister     Social history:  reports that she has never smoked. She has never used smokeless tobacco. She reports that she drinks alcohol. She reports that she does not use drugs.  Medications:  Prior to Admission medications   Medication Sig Start Date End Date Taking? Authorizing Provider  calcium carbonate (OS-CAL) 600 MG TABS tablet Take 1 tablet by mouth daily.   Yes [provider]  cholecalciferol (VITAMIN D) 1000 units tablet Take 1 tablet by mouth daily.   Yes [provider]  Omega-3 Fatty Acids (FISH OIL PO) Take 1 Dose by mouth daily.   Yes [provider]  omeprazole (PRILOSEC) 40 MG capsule Take 1 capsule (40 mg total) by mouth every morning. Take 30 minutes before breakfast. 03/12/18  Yes Willia Craze, NP  ranitidine (ZANTAC) 300 MG tablet Take 1 tablet (300 mg total) by mouth at bedtime. 03/12/18  Yes Willia Craze, NP  rizatriptan (MAXALT-MLT) 10 MG disintegrating tablet Take 10 mg by mouth as needed.   Yes [provider]  topiramate (TOPAMAX) 25 MG tablet Take one tablet at night for one week,  then take 2 tablets at night for one week, then take 3 tablets at night. 04/22/18   Kathrynn Ducking, MD      Allergies  Allergen Reactions  . Other Anaphylaxis    Bee  . Metronidazole Nausea And Vomiting  . Cefuroxime Axetil Nausea And Vomiting  . Sulfonamide Derivatives     REACTION: rash    ROS:  Out of a complete 14 system review of symptoms, the patient complains only of the following symptoms, and all other reviewed systems are negative.  Fatigue Ringing in the ears, vertigo Blurred vision Snoring Feeling hot Joint pain Allergies Confusion, headache, dizziness Anxiety Insomnia  Blood pressure 124/70, pulse  68, height 5\' 6"  (1.676 m), weight 176 lb (79.8 kg).  Physical Exam  General: The patient is alert and cooperative at the time of the examination.  The patient is moderately obese.  Eyes: Pupils are equal, round, and reactive to light. Discs are flat bilaterally.   Ears: Tympanic membranes are clear bilaterally.  Neck: The neck is supple, no carotid bruits are noted.  Respiratory: The respiratory examination is clear.  Cardiovascular: The cardiovascular examination reveals a regular rate and rhythm, no obvious murmurs or rubs are noted.  Neuromuscular: Range of movement the cervical spine is full.  Skin: Extremities are without significant edema.  Neurologic Exam  Mental status: The patient is alert and oriented x 3 at the time of the examination. The patient has apparent normal recent and remote memory, with an apparently normal attention span and concentration ability.  Cranial nerves: Facial symmetry is present. There is good sensation of the face to pinprick and soft touch bilaterally. The strength of the facial muscles and the muscles to head turning and shoulder shrug are normal bilaterally. Speech is well enunciated, no aphasia or dysarthria is noted. Extraocular movements are full. Visual fields are full. The tongue is midline, and the patient has symmetric elevation of the soft palate. No obvious hearing deficits are noted.  Motor: The motor testing reveals 5 over 5 strength of all 4 extremities. Good symmetric motor tone is noted throughout.  Sensory: Sensory testing is intact to pinprick, soft touch, vibration sensation, and position sense on all 4 extremities. No evidence of extinction is noted.  Coordination: Cerebellar testing reveals good finger-nose-finger and heel-to-shin bilaterally. The Nyan-Barrany procedure was performed, the patient did report a sensation of rocking with the head tilted back, no concurrent nystagmus was seen.  Gait and station: Gait is normal.  Tandem gait is slightly unsteady. Romberg is negative. No drift is seen.  Reflexes: Deep tendon reflexes are symmetric and normal bilaterally. Toes are downgoing bilaterally.   Assessment/Plan:  1.  Episodic vertigo  2.  Common migraine headache, intractable  The patient does report a rocking sensation, there is no concurrent nystagmus with this.  The patient reports a chronic issue with gait instability that has slightly worsened over the last year.  For this reason, MRI of the brain will be done with and without gadolinium enhancement.  The patient has had some blood Kozloski done through her primary care physician, BUN is 20, creatinine of 0.71.  TSH was 2.49.  The patient will be placed on Topamax for the migraine.  Occasionally, migraine may have vertigo associated with a headache or between headaches.  She will follow-up in 3 to 4 months.  Jill Alexanders MD 04/22/2018 10:08 AM  [facility] [address] [place], [place] [address]  Phone [phone] Fax [phone]

## 2018-04-22 NOTE — Telephone Encounter (Signed)
MR Brain w/wo contarst Dr. Stephanie Acre Auth: [id number] (exp. 04/22/18 to 05/21/18). Pt is scheduled at [facility] for 05/06/18.

## 2018-04-22 NOTE — Patient Instructions (Signed)
We will start Topamax for the headache.   Topamax (topiramate) is a seizure medication that has an FDA approval for seizures and for migraine headache. Potential side effects of this medication include weight loss, cognitive slowing, tingling in the fingers and toes, and carbonated drinks will taste bad. If any significant side effects are noted on this drug, please contact our office.  

## 2018-05-06 ENCOUNTER — Ambulatory Visit: Payer: BC Managed Care – PPO

## 2018-05-06 DIAGNOSIS — H81399 Other peripheral vertigo, unspecified ear: Secondary | ICD-10-CM | POA: Diagnosis not present

## 2018-05-06 MED ORDER — GADOPENTETATE DIMEGLUMINE 469.01 MG/ML IV SOLN
16.0000 mL | Freq: Once | INTRAVENOUS | Status: AC | PRN
  Administered 2018-05-06: 16 mL via INTRAVENOUS

## 2018-05-07 ENCOUNTER — Telehealth: Payer: Self-pay | Admitting: Neurology

## 2018-05-07 ENCOUNTER — Ambulatory Visit: Payer: BC Managed Care – PPO

## 2018-05-07 NOTE — Telephone Encounter (Signed)
I called patient.  MRI of the brain is normal.   MRI brain 05/07/18:  IMPRESSION:   Normal MRI brain (with and without).

## 2018-06-03 ENCOUNTER — Ambulatory Visit
Admission: RE | Admit: 2018-06-03 | Discharge: 2018-06-03 | Disposition: A | Discharge status: Home / Self Care | Payer: BC Managed Care – PPO | Source: Ambulatory Visit | Attending: Family Medicine | Admitting: Family Medicine

## 2018-06-03 DIAGNOSIS — Z1231 Encounter for screening mammogram for malignant neoplasm of breast: Secondary | ICD-10-CM

## 2018-08-22 ENCOUNTER — Ambulatory Visit: Payer: BC Managed Care – PPO | Admitting: Neurology

## 2019-01-14 ENCOUNTER — Other Ambulatory Visit: Payer: Self-pay | Admitting: Family Medicine

## 2019-01-14 ENCOUNTER — Ambulatory Visit
Admission: RE | Admit: 2019-01-14 | Discharge: 2019-01-14 | Disposition: A | Discharge status: Home / Self Care | Payer: Worker's Compensation | Source: Ambulatory Visit | Attending: Family Medicine | Admitting: Family Medicine

## 2019-01-14 DIAGNOSIS — W19XXXA Unspecified fall, initial encounter: Secondary | ICD-10-CM

## 2019-01-14 DIAGNOSIS — R0789 Other chest pain: Secondary | ICD-10-CM

## 2019-04-20 ENCOUNTER — Emergency Department (HOSPITAL_COMMUNITY): Payer: BC Managed Care – PPO

## 2019-04-20 ENCOUNTER — Emergency Department (HOSPITAL_COMMUNITY)
Admission: EM | Admit: 2019-04-20 | Discharge: 2019-04-20 | Disposition: A | Discharge status: Home / Self Care | Payer: BC Managed Care – PPO | Attending: Emergency Medicine | Admitting: Emergency Medicine

## 2019-04-20 ENCOUNTER — Other Ambulatory Visit: Payer: Self-pay

## 2019-04-20 DIAGNOSIS — R413 Other amnesia: Secondary | ICD-10-CM | POA: Diagnosis present

## 2019-04-20 DIAGNOSIS — R112 Nausea with vomiting, unspecified: Secondary | ICD-10-CM | POA: Diagnosis not present

## 2019-04-20 DIAGNOSIS — R42 Dizziness and giddiness: Secondary | ICD-10-CM | POA: Diagnosis not present

## 2019-04-20 DIAGNOSIS — Z79899 Other long term (current) drug therapy: Secondary | ICD-10-CM | POA: Diagnosis not present

## 2019-04-20 DIAGNOSIS — G43119 Migraine with aura, intractable, without status migrainosus: Secondary | ICD-10-CM | POA: Diagnosis not present

## 2019-04-20 LAB — URINALYSIS, ROUTINE W REFLEX MICROSCOPIC
Bacteria, UA: NONE SEEN
Bilirubin Urine: NEGATIVE
Glucose, UA: NEGATIVE mg/dL
Hgb urine dipstick: NEGATIVE
Ketones, ur: NEGATIVE mg/dL
Leukocytes,Ua: NEGATIVE
Nitrite: NEGATIVE
Protein, ur: 30 mg/dL — AB
Specific Gravity, Urine: 1.016 (ref 1.005–1.030)
pH: 9 — ABNORMAL HIGH (ref 5.0–8.0)

## 2019-04-20 LAB — CBC WITH DIFFERENTIAL/PLATELET
Abs Immature Granulocytes: 0.01 10*3/uL (ref 0.00–0.07)
Basophils Absolute: 0 10*3/uL (ref 0.0–0.1)
Basophils Relative: 1 %
Eosinophils Absolute: 0.1 10*3/uL (ref 0.0–0.5)
Eosinophils Relative: 1 %
HCT: 44.2 % (ref 36.0–46.0)
Hemoglobin: 14.3 g/dL (ref 12.0–15.0)
Immature Granulocytes: 0 %
Lymphocytes Relative: 17 %
Lymphs Abs: 1.1 10*3/uL (ref 0.7–4.0)
MCH: 29.5 pg (ref 26.0–34.0)
MCHC: 32.4 g/dL (ref 30.0–36.0)
MCV: 91.1 fL (ref 80.0–100.0)
Monocytes Absolute: 0.3 10*3/uL (ref 0.1–1.0)
Monocytes Relative: 5 %
Neutro Abs: 5.2 10*3/uL (ref 1.7–7.7)
Neutrophils Relative %: 76 %
Platelets: 258 10*3/uL (ref 150–400)
RBC: 4.85 MIL/uL (ref 3.87–5.11)
RDW: 13.8 % (ref 11.5–15.5)
WBC: 6.7 10*3/uL (ref 4.0–10.5)
nRBC: 0 % (ref 0.0–0.2)

## 2019-04-20 LAB — COMPREHENSIVE METABOLIC PANEL
ALT: 16 U/L (ref 0–44)
AST: 24 U/L (ref 15–41)
Albumin: 4.3 g/dL (ref 3.5–5.0)
Alkaline Phosphatase: 73 U/L (ref 38–126)
Anion gap: 14 (ref 5–15)
BUN: 11 mg/dL (ref 6–20)
CO2: 20 mmol/L — ABNORMAL LOW (ref 22–32)
Calcium: 9.2 mg/dL (ref 8.9–10.3)
Chloride: 107 mmol/L (ref 98–111)
Creatinine, Ser: 0.71 mg/dL (ref 0.44–1.00)
GFR calc Af Amer: >60 mL/min (ref >60)
GFR calc non Af Amer: >60 mL/min (ref >60)
Glucose, Bld: 123 mg/dL — ABNORMAL HIGH (ref 70–99)
Potassium: 4 mmol/L (ref 3.5–5.1)
Sodium: 141 mmol/L (ref 135–145)
Total Bilirubin: 0.6 mg/dL (ref 0.3–1.2)
Total Protein: 7.1 g/dL (ref 6.5–8.1)

## 2019-04-20 MED ORDER — METOCLOPRAMIDE HCL 5 MG/ML IJ SOLN
10.0000 mg | Freq: Once | INTRAMUSCULAR | Status: AC
  Administered 2019-04-20: 15:00:00 10 mg via INTRAVENOUS
  Filled 2019-04-20: qty 2

## 2019-04-20 MED ORDER — SODIUM CHLORIDE 0.9 % IV BOLUS
1000.0000 mL | Freq: Once | INTRAVENOUS | Status: AC
  Administered 2019-04-20: 1000 mL via INTRAVENOUS

## 2019-04-20 MED ORDER — DIPHENHYDRAMINE HCL 50 MG/ML IJ SOLN
25.0000 mg | Freq: Once | INTRAMUSCULAR | Status: AC
  Administered 2019-04-20: 25 mg via INTRAVENOUS
  Filled 2019-04-20: qty 1

## 2019-04-20 NOTE — ED Notes (Signed)
Patient transported to CT 

## 2019-04-20 NOTE — ED Notes (Signed)
Patient verbalizes understanding of discharge instructions. Opportunity for questioning and answers were provided. Armband removed by staff, pt discharged from ED.  

## 2019-04-20 NOTE — ED Triage Notes (Signed)
Pt in with memory loss onset since 0930 this am. Per pt's boyfriend, this began suddenly and pt forgot all names of pets/family members. Denies any weakness, does have severe migraines, took her Riatriptan at the onset of her HA this am

## 2019-04-20 NOTE — ED Provider Notes (Signed)
[facility] EMERGENCY DEPARTMENT Provider Note   CSN: [id number] Arrival date & time: 04/20/19  1059    History   Chief Complaint Chief Complaint  Patient presents with   Memory Loss    HPI Katherine Crosby is a 63 y.o. female.     63 y.o female with  A PMH of GERD, Hyperlipidemia, Migraines presents to the ED with a chief complaint of "memory loss". Patient reports she was at home when she was told her boyfriend called EMS as she was not able to recognize where she was, the name of her pets or what day of the week it was. She reports a left sided migraine that began this morning, states this felt like her usual migraine but she began to vomit along with have some nausea.  She also reports feeling dizzy, does have a previous history of vertigo but describes this was different.  She reports having to keep her eyes closed as this made her headache worse.  Patient has taken some Rizatriptan which did not help improve her symptoms.  Patient reports her symptoms have now resolved.  She denies any visual changes, slurred speech, trauma or chest pain.     Past Medical History:  Diagnosis Date   Allergy    Common migraine with intractable migraine 04/22/2018   GERD (gastroesophageal reflux disease)    Hyperlipidemia    Migraine headache     Patient Active Problem List   Diagnosis Date Noted   Common migraine with intractable migraine 04/22/2018    Past Surgical History:  Procedure Laterality Date   right ankle surgery w/plate and screws  78/29/5621   TONSILLECTOMY AND ADENOIDECTOMY       OB History   No obstetric history on file.      Home Medications    Prior to Admission medications   Medication Sig Start Date End Date Taking? Authorizing Provider  calcium carbonate (OS-CAL) 600 MG TABS tablet Take 1 tablet by mouth daily.    [provider]  cholecalciferol (VITAMIN D) 1000 units tablet Take 1 tablet by mouth daily.    [provider]  Omega-3 Fatty Acids (FISH OIL PO) Take 1 Dose by mouth daily.    [provider]  omeprazole (PRILOSEC) 40 MG capsule Take 1 capsule (40 mg total) by mouth every morning. Take 30 minutes before breakfast. 03/12/18   Willia Craze, NP  ranitidine (ZANTAC) 300 MG tablet Take 1 tablet (300 mg total) by mouth at bedtime. 03/12/18   Willia Craze, NP  rizatriptan (MAXALT-MLT) 10 MG disintegrating tablet Take 10 mg by mouth as needed.    [provider]  topiramate (TOPAMAX) 25 MG tablet Take one tablet at night for one week, then take 2 tablets at night for one week, then take 3 tablets at night. 04/22/18   Kathrynn Ducking, MD    Family History Family History  Problem Relation Age of Onset   Stroke Mother    Migraines Mother    Dementia Father    Migraines Sister     Social History Social History   Tobacco Use   Smoking status: Never Smoker   Smokeless tobacco: Never Used  Substance Use Topics   Alcohol use: Yes   Drug use: No     Allergies   Other; Metronidazole; Cefuroxime axetil; and Sulfonamide derivatives   Review of Systems Review of Systems  Constitutional: Negative for chills and fever.  HENT: Negative for ear pain and  sore throat.   Eyes: Negative for pain and visual disturbance.  Respiratory: Negative for cough and shortness of breath.   Cardiovascular: Negative for chest pain and palpitations.  Gastrointestinal: Positive for nausea and vomiting. Negative for abdominal pain.  Genitourinary: Negative for dysuria and hematuria.  Musculoskeletal: Negative for arthralgias, back pain and neck pain.  Skin: Negative for color change and rash.  Neurological: Positive for dizziness and headaches. Negative for seizures and syncope.  All other systems reviewed and are negative.    Physical Exam Updated Vital Signs BP (!) 147/73    Pulse 78    Temp 97.6 F (36.4 C) (Oral)    Resp 18    Wt 79.8 kg    SpO2 98%    BMI 28.40  kg/m   Physical Exam Vitals signs and nursing note reviewed.  Constitutional:      General: She is not in acute distress.    Appearance: She is well-developed.  HENT:     Head: Normocephalic and atraumatic.     Mouth/Throat:     Pharynx: No oropharyngeal exudate.  Eyes:     Pupils: Pupils are equal, round, and reactive to light.  Neck:     Musculoskeletal: Normal range of motion.  Cardiovascular:     Rate and Rhythm: Normal rate and regular rhythm.     Heart sounds: Normal heart sounds.  Pulmonary:     Effort: Pulmonary effort is normal. No respiratory distress.     Breath sounds: Normal breath sounds.  Abdominal:     General: Bowel sounds are normal. There is no distension.     Palpations: Abdomen is soft.     Tenderness: There is no abdominal tenderness.  Musculoskeletal:        General: No tenderness or deformity.     Right lower leg: No edema.     Left lower leg: No edema.  Skin:    General: Skin is warm and dry.  Neurological:     Mental Status: She is alert and oriented to person, place, and time.     Comments: Alert, oriented, thought content appropriate. Speech fluent without evidence of aphasia. Able to follow 2 step commands without difficulty.  Cranial Nerves:  II:  Peripheral visual fields grossly normal, pupils, round, reactive to light III,IV, VI: ptosis not present, extra-ocular motions intact bilaterally  V,VII: smile symmetric, facial light touch sensation equal VIII: hearing grossly normal bilaterally  IX,X: midline uvula rise  XI: bilateral shoulder shrug equal and strong XII: midline tongue extension  Motor:  5/5 in upper and lower extremities bilaterally including strong and equal grip strength and dorsiflexion/plantar flexion Sensory: light touch normal in all extremities.  Cerebellar: normal finger-to-nose with bilateral upper extremities, pronator drift negative Gait: normal gait and balance       ED Treatments / Results  Labs (all labs  ordered are listed, but only abnormal results are displayed) Labs Reviewed  COMPREHENSIVE METABOLIC PANEL - Abnormal; Notable for the following components:      Result Value   CO2 20 (*)    Glucose, Bld 123 (*)    All other components within normal limits  URINALYSIS, ROUTINE W REFLEX MICROSCOPIC - Abnormal; Notable for the following components:   pH 9.0 (*)    Protein, ur 30 (*)    All other components within normal limits  CBC WITH DIFFERENTIAL/PLATELET    EKG EKG Interpretation  Date/Time:  Monday April 20 2019 13:22:18 EDT Ventricular Rate:  75 PR Interval:  132 QRS Duration: 93 QT Interval:  383 QTC Calculation: 428 R Axis:   54 Text Interpretation:  Normal sinus rhythm Normal ECG Confirmed by Pattricia Boss [phone] on 04/20/2019 3:13:34 PM   Radiology Ct Head Wo Contrast  Result Date: 04/20/2019 CLINICAL DATA:  Memory loss.  Intermittent headaches EXAM: CT HEAD WITHOUT CONTRAST TECHNIQUE: Contiguous axial images were obtained from the base of the skull through the vertex without intravenous contrast. COMPARISON:  May 06, 2018 brain MRI FINDINGS: Brain: The ventricles are normal in size and configuration. 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 There is no intracranial mass, hemorrhage, extra-axial fluid collection, or midline shift. The brain parenchyma appears unremarkable. No evident acute infarct. Vascular: There is no hyperdense vessel. There is no appreciable vascular calcification. Skull: The bony calvarium appears intact. Sinuses/Orbits: The visualized paranasal sinuses are clear. Orbits appear symmetric bilaterally. Other: Mastoid air cells are clear. IMPRESSION: Stable frontal atrophy bilaterally. The ventricles appear normal in size and configuration for age. Brain parenchyma appears unremarkable. No mass or hemorrhage. Electronically Signed   By: Lowella Grip III M.D.   On: 04/20/2019 14:59    Procedures Procedures (including critical care time)  Medications  Ordered in ED Medications  sodium chloride 0.9 % bolus 1,000 mL (0 mLs Intravenous Stopped 04/20/19 1529)  diphenhydrAMINE (BENADRYL) injection 25 mg (25 mg Intravenous Given 04/20/19 1428)  metoCLOPramide (REGLAN) injection 10 mg (10 mg Intravenous Given 04/20/19 1431)     Initial Impression / Assessment and Plan / ED Course  I have reviewed the triage vital signs and the nursing notes.  Pertinent labs & imaging results that were available during my care of the patient were reviewed by me and considered in my medical decision making (see chart for details).     Patient with a previous history of migraine presents to the ED with complaints of left-sided headache, along with episode of "memory loss ", reports not being able to tell what day was, unable to recall the names of her pets, boyfriend did call EMS.  Patient symptoms have resolved.  She denies any slurred speech, dysarthria, visual changes. Will obtain laboratory results along with CT imaging to further evaluate patient at this time.   MP showed no electrolyte abnormality, creatinine level is within normal limits, these are unremarkable.  CBC showed no leukocytosis, hemoglobin is within normal limits.  UA showed no nitrates, leukocytes, white blood cell count.  During evaluation patient has no facial asymmetry, no slurred speech, neurologically intact.  Will obtain CT to further evaluate. CT head without contrast showed: Stable frontal atrophy bilaterally. The ventricles appear normal in  size and configuration for age. Brain parenchyma appears  unremarkable. No mass or hemorrhage.     Provided with a headache cocktail, reports her headache has improved, she was also given a liter of fluids to help with her symptoms.  Asymptomatic at this time, patient states "my cousin is a Community education officer at [employer], she says that that could have [place].  Patient has had no upper respiratory symptoms, fever, shortness of breath or chest pain. Two day send  out test was offered but patient deferred. After reviewing her chart, she had a prior MR brain on 05/07/2018 which showed stable frontal atrophy bilaterally, ventricles appeared normal.  Brain parenchyma was unremarkable there was no mass or hemorrhage.  This was other wise normal. Return precautions discussed at length with patient. She understands and agrees with plan, with stable vital signs, stable for discharge.   Portions of this  note were generated with [employer]. Dictation errors may occur despite best attempts at proofreading.    Final Clinical Impressions(s) / ED Diagnoses   Final diagnoses:  Intractable migraine with aura without status migrainosus    ED Discharge Orders    None       Janeece Fitting, PA-C 04/20/19 1545    Pattricia Boss, MD 04/22/19 1520

## 2019-04-20 NOTE — Discharge Instructions (Addendum)
Your CT today was normal.  Your laboratory results are within normal limits.  You will need to follow-up with [place] neurology, their phone number is attached to your chart.If you experience any visual changes, vomiting, worsening symptoms please return to the ED.

## 2019-04-29 ENCOUNTER — Other Ambulatory Visit: Payer: Self-pay | Admitting: Family Medicine

## 2019-04-30 ENCOUNTER — Other Ambulatory Visit: Payer: Self-pay | Admitting: Family Medicine

## 2019-04-30 DIAGNOSIS — R1011 Right upper quadrant pain: Secondary | ICD-10-CM

## 2019-05-13 ENCOUNTER — Ambulatory Visit
Admission: RE | Admit: 2019-05-13 | Discharge: 2019-05-13 | Disposition: A | Discharge status: Home / Self Care | Payer: BC Managed Care – PPO | Source: Ambulatory Visit | Attending: Family Medicine | Admitting: Family Medicine

## 2019-05-13 DIAGNOSIS — R1011 Right upper quadrant pain: Secondary | ICD-10-CM

## 2019-05-18 ENCOUNTER — Other Ambulatory Visit: Payer: Self-pay | Admitting: Family Medicine

## 2019-05-18 DIAGNOSIS — Z1231 Encounter for screening mammogram for malignant neoplasm of breast: Secondary | ICD-10-CM

## 2019-05-28 ENCOUNTER — Encounter: Payer: Self-pay | Admitting: General Surgery

## 2019-05-29 ENCOUNTER — Other Ambulatory Visit: Payer: Self-pay

## 2019-05-29 ENCOUNTER — Encounter: Payer: Self-pay | Admitting: Gastroenterology

## 2019-05-29 ENCOUNTER — Ambulatory Visit (INDEPENDENT_AMBULATORY_CARE_PROVIDER_SITE_OTHER): Payer: BC Managed Care – PPO | Admitting: Gastroenterology

## 2019-05-29 VITALS — Ht 66.0 in | Wt 180.0 lb

## 2019-05-29 DIAGNOSIS — R0989 Other specified symptoms and signs involving the circulatory and respiratory systems: Secondary | ICD-10-CM

## 2019-05-29 DIAGNOSIS — K219 Gastro-esophageal reflux disease without esophagitis: Secondary | ICD-10-CM

## 2019-05-29 DIAGNOSIS — R1013 Epigastric pain: Secondary | ICD-10-CM

## 2019-05-29 DIAGNOSIS — R1310 Dysphagia, unspecified: Secondary | ICD-10-CM

## 2019-05-29 MED ORDER — SUCRALFATE 1 GM/10ML PO SUSP: 1.0000 g | Freq: Four times a day (QID) | ORAL | 3 refills | Status: DC | PRN

## 2019-05-29 MED ORDER — SUCRALFATE 1 G PO TABS: 1.0000 g | ORAL_TABLET | Freq: Four times a day (QID) | ORAL | 3 refills | Status: DC | PRN

## 2019-05-29 NOTE — Progress Notes (Signed)
Virtual Visit via Video Note  I connected with Brennen Gardiner Godshall on 05/29/19 at  3:30 PM EDT by a video enabled telemedicine application and verified that I am speaking with the correct person using two identifiers.  I discussed the limitations of evaluation and management by telemedicine and the availability of in person appointments. The patient expressed understanding and agreed to proceed.  THIS ENCOUNTER IS A VIRTUAL VISIT DUE TO COVID-19 - PATIENT WAS NOT SEEN IN THE OFFICE. PATIENT HAS CONSENTED TO VIRTUAL VISIT / TELEMEDICINE VISIT USING DOXIMITY APP   Location of patient: home Location of provider: office Persons participating: myself, patient     HPI :  63 y/o female here for a follow up visit. She has been followed by [place] for GERD in the past. 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 She had been on and off omeprazole dosing for several months. She had been doing fairly well until symptoms recurred in January and she was placed on protonix 40mg  once daily and stopped omeprazole. That worked well for her at the time and had no problems until this past June she has had recurrence and since that time on protonix 40mg  twice daily.   She has had "worsening reflux". By this she refers to a sense of globus win her throat with sore throat at times, and she is belching frequently. She does have occasional pyrosis, she does not have regurgitation but some acid taste in the mouth. She has occasional dysphagia in her throat, often to breads and muffins or tablets, she has stopped eating these foods since it recurred. She had the same dysphagia in 2018, she had a dilation helped significantly when she had that done in 2018. She had benefit for several months after dilation. She also states her symptoms in general were better after that.   She has not had much benefit since twice daily protonix started. She has been taking some carafate tablets PRN  which she thinks did help significantly but she can out. She has had some epigastric pain that has been bothering her which can come and go. Sometimes eating feels worse, sometimes makes it better.   No blood in the stools.    She had an US of the RUQ on July 1st which showed no gallstones.   Prior workup: EGD 07/11/2017 - 2cm HH, normal esophagus, biopsies taken to rule out EoE, empiric dilation done to 72mm with small wrent at UES, mild gastritis  [place] RUQ 05/13/19 - normal  Colonoscopy 04/25/2010 - normal     Past Medical History:  Diagnosis Date   Allergy    Common migraine with intractable migraine 04/22/2018   GERD (gastroesophageal reflux disease)    Hyperlipidemia    Migraine headache      Past Surgical History:  Procedure Laterality Date   right ankle surgery w/plate and screws  49/67/5916   TONSILLECTOMY AND ADENOIDECTOMY     Family History  Problem Relation Age of Onset   Stroke Mother    Migraines Mother    Dementia Father    Migraines Sister    Colon cancer Neg Hx    Esophageal cancer Neg Hx    Pancreatic cancer Neg Hx    Liver cancer Neg Hx    Rectal cancer Neg Hx    Stomach cancer Neg Hx    Social History   Tobacco Use   Smoking status: Never Smoker   Smokeless tobacco: Never  Used  Substance Use Topics   Alcohol use: Yes    Alcohol/week: 5.0 standard drinks    Types: 5 Glasses of wine per week   Drug use: No   Current Outpatient Medications  Medication Sig Dispense Refill   calcium carbonate (OS-CAL) 600 MG TABS tablet Take 1 tablet by mouth daily.     cholecalciferol (VITAMIN D) 1000 units tablet Take 1 tablet by mouth daily.     Omega-3 Fatty Acids (FISH OIL PO) Take 1 Dose by mouth daily.     omeprazole (PRILOSEC) 40 MG capsule Take 1 capsule (40 mg total) by mouth every morning. Take 30 minutes before breakfast. (Patient not taking: Reported on 05/28/2019) 30 capsule 6   pantoprazole (PROTONIX) 40 MG tablet Take 40 mg  by mouth 2 (two) times daily.     pravastatin (PRAVACHOL) 20 MG tablet Take 20 mg by mouth daily.     rizatriptan (MAXALT-MLT) 10 MG disintegrating tablet Take 10 mg by mouth as needed.     sucralfate (CARAFATE) 1 g tablet TK 1 T PO BID OES     topiramate (TOPAMAX) 25 MG tablet Take one tablet at night for one week, then take 2 tablets at night for one week, then take 3 tablets at night. 90 tablet 3   Current Facility-Administered Medications  Medication Dose Route Frequency Provider Last Rate Last Dose   0.9 %  sodium chloride infusion  500 mL Intravenous Continuous Delina Kruczek, Carlota Raspberry, MD       Allergies  Allergen Reactions   Other Anaphylaxis    Bee   Metronidazole Nausea And Vomiting   Cefuroxime Axetil Nausea And Vomiting   Sulfonamide Derivatives     REACTION: rash     Review of Systems: All systems reviewed and negative except where noted in HPI.    US Abdomen Limited Ruq  Result Date: 05/13/2019 CLINICAL DATA:  RIGHT upper quadrant abdominal pain for 1 month EXAM: ULTRASOUND ABDOMEN LIMITED RIGHT UPPER QUADRANT COMPARISON:  CT abdomen and pelvis 03/29/2010 FINDINGS: Gallbladder: Normally distended without stones or wall thickening. No pericholecystic fluid or sonographic Murphy sign. Common bile duct: Diameter: 5 mm, normal Liver: Normal echogenicity without mass or nodularity. Portal vein is patent on color Doppler imaging with normal direction of blood flow towards the liver. No RIGHT upper quadrant free fluid. IMPRESSION: Normal exam. Electronically Signed   By: Lavonia Dana M.D.   On: 05/13/2019 14:19   Lab Results  Component Value Date   WBC 6.7 04/20/2019   HGB 14.3 04/20/2019   HCT 44.2 04/20/2019   MCV 91.1 04/20/2019   PLT 258 04/20/2019    Lab Results  Component Value Date   CREATININE 0.71 04/20/2019   BUN 11 04/20/2019   NA 141 04/20/2019   K 4.0 04/20/2019   CL 107 04/20/2019   CO2 20 (L) 04/20/2019    Lab Results  Component Value Date    ALT 16 04/20/2019   AST 24 04/20/2019   ALKPHOS 73 04/20/2019   BILITOT 0.6 04/20/2019      Physical Exam: Ht 5\' 6"  (1.676 m)    Wt 180 lb (81.6 kg) Comment: pt provided over the phone   BMI 29.05 kg/m  Constitutional: Pleasant,well-developed, female in no acute distress. Psychiatric: Normal mood and affect. Behavior is normal.   ASSESSMENT AND PLAN: 63 y/o female here for reassessment of the following issues:  GERD / Dysphagia / Globus / Epigastric pain - previously responded well to omeprazole, lost response, then  did well to protonix once daily, symptoms have gotten worse and persistent despite twice daily dosing. 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 Otherwise not sure if she is having nonacid reflux, or hypersensitive esophagus / functional heartburn. I think the likelihood of PUD on high dose PPI is probably low, and she has no gallstones, in related to her epigastric pain. I discussed options with her. Given she had benefit from globus and dysphagia with prior dilation and these symptoms have recurred, I offered her a repeat EGD to dilate, and reassess anatomy and size of hiatal hernia. If symptoms persist despite that, then would proceed with manometry and pH testing to further evaluate. If she has significant nonacid reflux on pH test, would consider TIF evaluation. She agreed with the plan, after discussion of endoscopy and anesthesia she wanted to proceed. Will continue protonix for now and add back carafate but give her liquid form given she is having hard time with tablets, can use every 6 hours.  [place] Cellar, MD [facility]

## 2019-05-29 NOTE — Progress Notes (Signed)
Called and informed pt of change from suspension to tablets and how to make a slurry

## 2019-05-29 NOTE — Patient Instructions (Signed)
If you are age 61 or older, your body mass index should be between 23-30. Your Body mass index is 29.05 kg/m. If this is out of the aforementioned range listed, please consider follow up with your Primary Care Provider.  If you are age 34 or younger, your body mass index should be between 19-25. Your Body mass index is 29.05 kg/m. If this is out of the aformentioned range listed, please consider follow up with your Primary Care Provider.   To help prevent the possible spread of infection to our patients, communities, and staff; we will be implementing the following measures:  As of now we are not allowing any visitors/family members to accompany you to any upcoming appointments with [facility]. If you have any concerns about this please contact our office to discuss prior to the appointment.   We have sent the following medications to your pharmacy for you to pick up at your convenience: Carafate suspension 68ml: Take every 6 hours as needed  You have been scheduled for an endoscopy. Please follow written instructions given to you at your visit today. If you use inhalers (even only as needed), please bring them with you on the day of your procedure. Your physician has requested that you go to www.startemmi.com and enter the access code given to you at your visit today. This web site gives a general overview about your procedure. However, you should still follow specific instructions given to you by our office regarding your preparation for the procedure.  Thank you for entrusting me with your care and for choosing [facility], Dr. [place] Cellar

## 2019-06-01 NOTE — Progress Notes (Signed)
Virtual Visit via Video Note The purpose of this virtual visit is to provide medical care while limiting exposure to the novel coronavirus.    Consent was obtained for video visit:  Yes Answered questions that patient had about telehealth interaction:  Yes I discussed the limitations, risks, security and privacy concerns of performing an evaluation and management service by telemedicine. I also discussed with the patient that there may be a patient responsible charge related to this service. The patient expressed understanding and agreed to proceed.  Pt location: Home Physician Location: office Name of referring provider:  Harlan Stains, MD I connected with Katherine Crosby at patients initiation/request on 06/03/2019 at  9:10 AM EDT by video enabled telemedicine application and verified that I am speaking with the correct person using two identifiers. Pt MRN:  [id number] Pt DOB:  [birth year] Video Participants:  Katherine Crosby   History of Present Illness:  Katherine Crosby is a 63 year old woman who presents for migraine.  History supplemented by referring provider notes.  She reports history of migraines since her 20s-30s.  They are severe sharp/squeezing left high parietal pain.  No preceding aura.  They typically last 3 hours and occur twice a month (typically wakes up with them).  Rarely may occur all day.  They are associated with nausea, sometimes vomiting and blurred vision.  No associated photophobia, phonophobia, or unilateral numbness and weakness.  They are triggered by seasonal allergies (she has "sinus headaches" in fall and spring that may aggravate migraine.  She treats with Advil Sinus to prevent migraine) and relieved by shower.  In 2019, she had vertigo.  MRI of brain performed on 05/07/18 was personally reviewed and demonstrated some mild bilateral frontal atrophy but otherwise unremarkable.  She still has some dizziness.  She presented to the ED on 04/20/19 for memory  deficits.  She had a left sided migraine headache that day.  She went to bed and when she woke up later, she didn't know her birthday, her age, or why furniture was moved (they were making changes in the house).  She has only very brief memory of that time.  She had a very slight headache then.  The event lasted a couple of hours.  CT head was personally reviewed and which again demonstrated stable bilateral frontal atrophy but otherwise no acute abnormalities. She was treated with headache cocktail and discharged home.  Current NSAIDS:  Advil Sinus (for "sinus headaches") Current analgesics:  none Current triptans:  Rizatriptan 10mg  Current ergotamine:  none Current anti-emetic:  none Current muscle relaxants:  none Current anti-anxiolytic:  none Current sleep aide:  none Current Antihypertensive medications:  none Current Antidepressant medications:  none Current Anticonvulsant medications:  none Current anti-CGRP:  none Current Vitamins/Herbal/Supplements:  none Current Antihistamines/Decongestants:  Os-Cal Other therapy:  none Hormone/birth control:  none  Past NSAIDS:  Ibuprofen  Past analgesics:  Tylenol Past abortive triptans:  none Past abortive ergotamine:  none Past muscle relaxants:  none Past anti-emetic:  Does not remember Past antihypertensive medications:  none Past antidepressant medications:  none Past anticonvulsant medications:  none Past anti-CGRP:  none Past vitamins/Herbal/Supplements:  none Past antihistamines/decongestants:  OTC Other past therapies:  none  Caffeine:  1 Dr. Malachi Bonds a day (small bottle) Diet:  Increased water intake Exercise:  Almost daily Depression:  no; Anxiety:  Yes (worried about job, going to grocery store, her mother's health all related to [name]) Other pain:  Joint pain Sleep hygiene:  poor Family  history of headache:  Mother and sister (migraines), grandmother had headaches  Past Medical History: Past Medical History:   Diagnosis Date  . Allergy   . Common migraine with intractable migraine 04/22/2018  . GERD (gastroesophageal reflux disease)   . Hyperlipidemia   . Migraine headache     Medications: Outpatient Encounter Medications as of 06/03/2019  Medication Sig  . calcium carbonate (OS-CAL) 600 MG TABS tablet Take 1 tablet by mouth daily.  . cholecalciferol (VITAMIN D) 1000 units tablet Take 1 tablet by mouth daily.  . Omega-3 Fatty Acids (FISH OIL PO) Take 1 Dose by mouth daily.  Marland Kitchen omeprazole (PRILOSEC) 40 MG capsule Take 1 capsule (40 mg total) by mouth every morning. Take 30 minutes before breakfast. (Patient not taking: Reported on 05/28/2019)  . pantoprazole (PROTONIX) 40 MG tablet Take 40 mg by mouth 2 (two) times daily.  . pravastatin (PRAVACHOL) 20 MG tablet Take 20 mg by mouth daily.  . rizatriptan (MAXALT-MLT) 10 MG disintegrating tablet Take 10 mg by mouth as needed.  . sucralfate (CARAFATE) 1 g tablet Take 1 tablet (1 g total) by mouth every 6 (six) hours as needed. Slowly dissolve tablet in 1 Tablespoon of distilled water for about 15 minutes prior to ingestion.  . topiramate (TOPAMAX) 25 MG tablet Take one tablet at night for one week, then take 2 tablets at night for one week, then take 3 tablets at night.   Facility-Administered Encounter Medications as of 06/03/2019  Medication  . 0.9 %  sodium chloride infusion    Allergies: Allergies  Allergen Reactions  . Other Anaphylaxis    Bee  . Metronidazole Nausea And Vomiting  . Cefuroxime Axetil Nausea And Vomiting  . Sulfonamide Derivatives     REACTION: rash    Family History: Family History  Problem Relation Age of Onset  . Stroke Mother   . Migraines Mother   . Dementia Father   . Migraines Sister   . Colon cancer Neg Hx   . Esophageal cancer Neg Hx   . Pancreatic cancer Neg Hx   . Liver cancer Neg Hx   . Rectal cancer Neg Hx   . Stomach cancer Neg Hx     Social History: Social History   Socioeconomic History  .  Marital status: Single    Spouse name: Not on file  . Number of children: 0  . Years of education: Masters  . Highest education level: Not on file  Occupational History  . Occupation: [employer]  Social Needs  . Financial resource strain: Not on file  . Food insecurity    Worry: Not on file    Inability: Not on file  . Transportation needs    Medical: Not on file    Non-medical: Not on file  Tobacco Use  . Smoking status: Never Smoker  . Smokeless tobacco: Never Used  Substance and Sexual Activity  . Alcohol use: Yes    Alcohol/week: 5.0 standard drinks    Types: 5 Glasses of wine per week  . Drug use: No  . Sexual activity: Not Currently  Lifestyle  . Physical activity    Days per week: Not on file    Minutes per session: Not on file  . Stress: Not on file  Relationships  . Social Herbalist on phone: Not on file    Gets together: Not on file    Attends religious service: Not on file    Active member of club  or organization: Not on file    Attends meetings of clubs or organizations: Not on file    Relationship status: Not on file  . Intimate partner violence    Fear of current or ex partner: Not on file    Emotionally abused: Not on file    Physically abused: Not on file    Forced sexual activity: Not on file  Other Topics Concern  . Not on file  Social History Narrative   Lives w/ Florina Ou.   Caffeine use: 1 cup tea daily   Rare soda   Right handed     Observations/Objective:   Height 5\' 6"  (1.676 m), weight 180 lb (81.6 kg). No acute distress.  Alert and oriented.  Speech fluent and not dysarthric.  Language intact.  Eyes orthophoric on primary gaze.  Face symmetric.  Assessment and Plan:   1.  Migraine without aura, without status migrainosus, not intractable 2.  Episode of amnesia/altered mental status.  Likely migraine aura.  However, we should evaluate for other etiologies.  1.  Rizatriptan as needed.  Limit use of pain relievers to no more  than 2 days out of week to prevent risk of rebound or medication-overuse headache. 2.  Migraines infrequent.  Preventative not indicated. 3.  MRI of brain with and without contrast 4.  EEG 5.  Further recommendations pending results.  Follow Up Instructions:    -I discussed the assessment and treatment plan with the patient. The patient was provided an opportunity to ask questions and all were answered. The patient agreed with the plan and demonstrated an understanding of the instructions.   The patient was advised to call back or seek an in-person evaluation if the symptoms worsen or if the condition fails to improve as anticipated.    Dudley Major, DO

## 2019-06-03 ENCOUNTER — Encounter: Payer: Self-pay | Admitting: Neurology

## 2019-06-03 ENCOUNTER — Telehealth (INDEPENDENT_AMBULATORY_CARE_PROVIDER_SITE_OTHER): Payer: BC Managed Care – PPO | Admitting: Neurology

## 2019-06-03 ENCOUNTER — Other Ambulatory Visit: Payer: Self-pay

## 2019-06-03 ENCOUNTER — Telehealth: Payer: Self-pay | Admitting: *Deleted

## 2019-06-03 VITALS — Ht 66.0 in | Wt 180.0 lb

## 2019-06-03 DIAGNOSIS — R404 Transient alteration of awareness: Secondary | ICD-10-CM | POA: Diagnosis not present

## 2019-06-03 DIAGNOSIS — G43009 Migraine without aura, not intractable, without status migrainosus: Secondary | ICD-10-CM

## 2019-06-03 NOTE — Telephone Encounter (Signed)
LMOM to call to schedule her EEG °

## 2019-06-03 NOTE — Addendum Note (Signed)
Addended by: Clois Comber on: 06/03/2019 10:12 AM   Modules accepted: Orders

## 2019-06-08 ENCOUNTER — Other Ambulatory Visit: Payer: Self-pay

## 2019-06-08 ENCOUNTER — Ambulatory Visit (INDEPENDENT_AMBULATORY_CARE_PROVIDER_SITE_OTHER): Payer: BC Managed Care – PPO | Admitting: Neurology

## 2019-06-08 DIAGNOSIS — R404 Transient alteration of awareness: Secondary | ICD-10-CM | POA: Diagnosis not present

## 2019-06-09 ENCOUNTER — Telehealth: Payer: Self-pay | Admitting: Gastroenterology

## 2019-06-09 ENCOUNTER — Telehealth: Payer: Self-pay

## 2019-06-09 NOTE — Telephone Encounter (Signed)

## 2019-06-09 NOTE — Telephone Encounter (Signed)
-----   Message from Adam R Jaffe, DO sent at 06/09/2019 12:37 PM EDT ----- EEG is normal 

## 2019-06-09 NOTE — Telephone Encounter (Signed)
Called patient she was made aware of results

## 2019-06-09 NOTE — Procedures (Signed)
ELECTROENCEPHALOGRAM REPORT  Date of Study: 06/08/2019  Patient's Name: Katherine Crosby MRN: [id number] Date of Birth: 01-31-1956   Clinical History: 63 year old woman with transient episode of memory loss  Medications: OS-CAL 600 MG TABS tablet VITAMIN D 1000 units tablet FISH OIL PO PROTONIX 40 MG tablet PRAVACHOL 20 MG tablet MAXALT-MLT 10 MG disintegrating tablet CARAFATE 1 g tablet  Technical Summary: A multichannel digital EEG recording measured by the international 10-20 system with electrodes applied with paste and impedances below 5000 ohms performed in our laboratory with EKG monitoring in an awake and drowsy patient.  Hyperventilation not performed.  Photic stimulation was performed.  The digital EEG was referentially recorded, reformatted, and digitally filtered in a variety of bipolar and referential montages for optimal display.    Description: The patient is awake and drowsy during the recording.  During maximal wakefulness, there is a symmetric, medium voltage 11 Hz posterior dominant rhythm that attenuates with eye opening.  The record is symmetric.  During drowsiness, there is an increase in theta slowing of the background.  Stage 2 sleep was not seen.  Hyperventilation and photic stimulation did not elicit any abnormalities.  There were no epileptiform discharges or electrographic seizures seen.    EKG lead was unremarkable.  Impression: This awake and drowsy EEG is normal.    Clinical Correlation: A normal EEG does not exclude a clinical diagnosis of epilepsy.  If further clinical questions remain, prolonged EEG may be helpful.  Clinical correlation is advised.   Metta Clines, DO

## 2019-06-10 ENCOUNTER — Ambulatory Visit (AMBULATORY_SURGERY_CENTER): Payer: BC Managed Care – PPO | Admitting: Gastroenterology

## 2019-06-10 ENCOUNTER — Encounter: Payer: Self-pay | Admitting: Gastroenterology

## 2019-06-10 ENCOUNTER — Other Ambulatory Visit: Payer: Self-pay

## 2019-06-10 VITALS — BP 146/79 | HR 90 | Temp 98.2°F | Resp 18 | Ht 66.0 in | Wt 180.0 lb

## 2019-06-10 DIAGNOSIS — R0989 Other specified symptoms and signs involving the circulatory and respiratory systems: Secondary | ICD-10-CM

## 2019-06-10 DIAGNOSIS — R1013 Epigastric pain: Secondary | ICD-10-CM | POA: Diagnosis not present

## 2019-06-10 DIAGNOSIS — R1310 Dysphagia, unspecified: Secondary | ICD-10-CM

## 2019-06-10 DIAGNOSIS — K219 Gastro-esophageal reflux disease without esophagitis: Secondary | ICD-10-CM | POA: Diagnosis not present

## 2019-06-10 DIAGNOSIS — K449 Diaphragmatic hernia without obstruction or gangrene: Secondary | ICD-10-CM | POA: Diagnosis not present

## 2019-06-10 DIAGNOSIS — K317 Polyp of stomach and duodenum: Secondary | ICD-10-CM

## 2019-06-10 MED ORDER — SODIUM CHLORIDE 0.9 % IV SOLN: 500.0000 mL | Freq: Once | INTRAVENOUS | Status: DC

## 2019-06-10 NOTE — Patient Instructions (Signed)
Please follow Dilation Diet. Await pathology results. Continue present medications. Please read handouts provided.       YOU HAD AN ENDOSCOPIC PROCEDURE TODAY AT [facility]:   Refer to the procedure report that was given to you for any specific questions about what was found during the examination.  If the procedure report does not answer your questions, please call your gastroenterologist to clarify.  If you requested that your care partner not be given the details of your procedure findings, then the procedure report has been included in a sealed envelope for you to review at your convenience later.  YOU SHOULD EXPECT: Some feelings of bloating in the abdomen. Passage of more gas than usual.  Walking can help get rid of the air that was put into your GI tract during the procedure and reduce the bloating. If you had a lower endoscopy (such as a colonoscopy or flexible sigmoidoscopy) you may notice spotting of blood in your stool or on the toilet paper. If you underwent a bowel prep for your procedure, you may not have a normal bowel movement for a few days.  Please Note:  You might notice some irritation and congestion in your nose or some drainage.  This is from the oxygen used during your procedure.  There is no need for concern and it should clear up in a day or so.  SYMPTOMS TO REPORT IMMEDIATELY:     Following upper endoscopy (EGD)  Vomiting of blood or coffee ground material  New chest pain or pain under the shoulder blades  Painful or persistently difficult swallowing  New shortness of breath  Fever of 100F or higher  Black, tarry-looking stools  For urgent or emergent issues, a gastroenterologist can be reached at any hour by calling [phone].   DIET  Drink plenty of fluids but you should avoid alcoholic beverages for 24 hours.  ACTIVITY:  You should plan to take it easy for the rest of today and you should NOT DRIVE or use heavy machinery until  tomorrow (because of the sedation medicines used during the test).    FOLLOW UP: Our staff will call the number listed on your records 48-72 hours following your procedure to check on you and address any questions or concerns that you may have regarding the information given to you following your procedure. If we do not reach you, we will leave a message.  We will attempt to reach you two times.  During this call, we will ask if you have developed any symptoms of COVID 19. If you develop any symptoms (ie: fever, flu-like symptoms, shortness of breath, cough etc.) before then, please call [phone].  If you test positive for Covid 19 in the 2 weeks post procedure, please call and report this information to [place].    If any biopsies were taken you will be contacted by phone or by letter within the next 1-3 weeks.  Please call us at [phone] if you have not heard about the biopsies in 3 weeks.    SIGNATURES/CONFIDENTIALITY: You and/or your care partner have signed paperwork which will be entered into your electronic medical record.  These signatures attest to the fact that that the information above on your After Visit Summary has been reviewed and is understood.  Full responsibility of the confidentiality of this discharge information lies with you and/or your care-partner.

## 2019-06-10 NOTE — Progress Notes (Signed)
Report given to PACU, vss 

## 2019-06-10 NOTE — Progress Notes (Signed)
Pt's states no medical or surgical changes since previsit or office visit. 

## 2019-06-10 NOTE — Op Note (Signed)
[facility] Patient Name: Katherine Crosby Procedure Date: 06/10/2019 4:05 PM MRN: [id number] Endoscopist: Remo Lipps P. Havery Moros , MD Age: 63 Referring MD:  Date of Birth: 11-15-1955 Gender: Female Account #: [id number] Procedure:                Upper GI endoscopy Indications:              Dysphagia, Follow-up of gastro-esophageal reflux                            disease, Globus sensation, epigastric discomfort -                            on protonix 40mg  twice daily with persistent                            symptoms, patient has had benefit with Savary                            dilation in the past Medicines:                Monitored Anesthesia Care Procedure:                Pre-Anesthesia Assessment:                           - Prior to the procedure, a History and Physical                            was performed, and patient medications and                            allergies were reviewed. The patient's tolerance of                            previous anesthesia was also reviewed. The risks                            and benefits of the procedure and the sedation                            options and risks were discussed with the patient.                            All questions were answered, and informed consent                            was obtained. Prior Anticoagulants: The patient has                            taken no previous anticoagulant or antiplatelet                            agents. ASA Grade Assessment: II - A patient with  mild systemic disease. After reviewing the risks                            and benefits, the patient was deemed in                            satisfactory condition to undergo the procedure.                           After obtaining informed consent, the endoscope was                            passed under direct vision. Throughout the                            procedure, the patient's blood pressure,  pulse, and                            oxygen saturations were monitored continuously. The                            Endoscope was introduced through the mouth, and                            advanced to the second part of duodenum. The upper                            GI endoscopy was accomplished without difficulty.                            The patient tolerated the procedure well. Scope In: Scope Out: Findings:                 Esophagogastric landmarks were identified: the                            Z-line was found at 34 cm, the gastroesophageal                            junction was found at 34 cm and the upper extent of                            the gastric folds was found at 36 cm from the                            incisors.                           A 2 cm hiatal hernia was present.                           The exam of the esophagus was otherwise normal.  Biopsies not taken given prior biopsies for EoE                            were negative.                           A guidewire was placed and the scope was withdrawn.                            Empiric dilation was performed in the entire                            esophagus with a Savary dilator with mild                            resistance at 17 mm and 18 mm.                           Multiple diminutive sessile polyps were found in                            the gastric fundus and in the gastric body. 2                            representative polyps were removed with a cold                            biopsy forceps. Resection and retrieval were                            complete.                           The exam of the stomach was otherwise normal.                           Biopsies were taken with a cold forceps in the                            gastric body, at the incisura and in the gastric                            antrum for Helicobacter pylori testing.                            The duodenal bulb and second portion of the                            duodenum were normal. Complications:            No immediate complications. Estimated blood loss:                            Minimal. Estimated Blood Loss:     Estimated blood loss was minimal. Impression:               -  Esophagogastric landmarks identified.                           - 2 cm hiatal hernia.                           - Normal esophagus otherwise. Empiric dilation                            performed to 69mm given response of dysphagia /                            globus in the past to this.                           - Multiple benign appearing diminutive gastric                            polyps. 2 representative samples resected and                            retrieved.                           - Normal stomach otherwise, biopsies taken to rule                            out H pylori                           - Normal duodenal bulb and second portion of the                            duodenum. Recommendation:           - Patient has a contact number available for                            emergencies. The signs and symptoms of potential                            delayed complications were discussed with the                            patient. Return to normal activities tomorrow.                            Written discharge instructions were provided to the                            patient.                           - Resume previous diet.                           - Continue present medications.                           -  Await pathology results and course post dilation [name] P. Aldine Grainger, MD 06/10/2019 4:46:10 PM This report has been signed electronically.

## 2019-06-10 NOTE — Progress Notes (Signed)
Called to room to assist during endoscopic procedure.  Patient ID and intended procedure confirmed with present staff. Received instructions for my participation in the procedure from the performing physician.  

## 2019-06-12 ENCOUNTER — Telehealth: Payer: Self-pay

## 2019-06-12 NOTE — Telephone Encounter (Signed)
  Follow up Call-  Call back number 06/10/2019 07/11/2017  Post procedure Call Back phone  # [phone]  Permission to leave phone message Yes Yes  Some recent data might be hidden     Patient questions:  Do you have a fever, pain , or abdominal swelling? No. Pain Score  0 *  Have you tolerated food without any problems? Yes.    Have you been able to return to your normal activities? Yes.    Do you have any questions about your discharge instructions: Diet   No. Medications  No. Follow up visit  No.  Do you have questions or concerns about your Care? No.  Actions: * If pain score is 4 or above: No action needed, pain <4.  1. Have you developed a fever since your procedure? no  2.   Have you had an respiratory symptoms (SOB or cough) since your procedure? no  3.   Have you tested positive for COVID 19 since your procedure no  4.   Have you had any family members/close contacts diagnosed with the COVID 19 since your procedure?  no   If yes to any of these questions please route to Joylene John, RN and Alphonsa Gin, Therapist, sports.

## 2019-06-16 ENCOUNTER — Encounter: Payer: Self-pay | Admitting: Gastroenterology

## 2019-06-30 ENCOUNTER — Ambulatory Visit: Payer: BC Managed Care – PPO

## 2019-07-01 ENCOUNTER — Other Ambulatory Visit: Payer: Self-pay

## 2019-07-01 ENCOUNTER — Ambulatory Visit
Admission: RE | Admit: 2019-07-01 | Discharge: 2019-07-01 | Disposition: A | Discharge status: Home / Self Care | Payer: BC Managed Care – PPO | Source: Ambulatory Visit | Attending: Neurology | Admitting: Neurology

## 2019-07-01 ENCOUNTER — Telehealth: Payer: Self-pay | Admitting: Gastroenterology

## 2019-07-01 DIAGNOSIS — R404 Transient alteration of awareness: Secondary | ICD-10-CM

## 2019-07-01 MED ORDER — GADOBENATE DIMEGLUMINE 529 MG/ML IV SOLN
17.0000 mL | Freq: Once | INTRAVENOUS | Status: AC | PRN
  Administered 2019-07-01: 09:00:00 17 mL via INTRAVENOUS

## 2019-07-01 NOTE — Telephone Encounter (Signed)
Called patient back. She states she is still having the feeling of a lump in the upper part of her throat, epi-gastric pain, and lots of belching. She is taking Protonix BID, but only taking Carafate once a day. I encouraged her to take the Carafate up to every 6 hrs. As needed.  Please advise

## 2019-07-02 NOTE — Telephone Encounter (Signed)
Thanks Customer service manager. Sorry to hear the dilation did not help recently, previously it provided her some benefit. If symptoms persist despite PPI, then I think 24 hour pH impedance testing with manometry is the next step to clarify if reflux is driving her symptoms versus something else. I would like for this to be done on protonix twice daily. If she is amenable can you please schedule her for it. Agree with carafate q 6 hours however she should NOT use it during the time of her pH study. Thanks

## 2019-07-02 NOTE — Telephone Encounter (Signed)
Called patient and she agreed to the esophageal manometry with 24 hr.pH test. Scheduled for 07/15/19 at [facility] patient to arrive at 8:00am. Patient notified verbally on the phone and by letter of the appt. And instructions to stay on her Protonix BID and hold her Carafate during the procedure and while the probe is in

## 2019-07-02 NOTE — Progress Notes (Signed)
Dear Mardene Celeste:

## 2019-07-03 ENCOUNTER — Telehealth: Payer: Self-pay

## 2019-07-03 NOTE — Telephone Encounter (Signed)
-----   Message from Pieter Partridge, DO sent at 07/02/2019  7:02 PM EDT ----- MRI of brain shows some mild spots on the brain which are likely just age-related changes.  Nothing that looks concerning.  As discussed, her event was likely migraine.  She may follow up as needed.

## 2019-07-03 NOTE — Telephone Encounter (Signed)
Patient advised of MRI results.

## 2019-07-14 ENCOUNTER — Telehealth: Payer: Self-pay

## 2019-07-14 NOTE — Telephone Encounter (Signed)
Spoke to patient on phone and gave rescheduled Esophogeal Mano/24 hr.pH study date: 08/03/19 at [facility] patient to arrive at 10:15am. COVID testing scheduled for 07/30/19 between 9am-3pm at [facility]. Patient advised to get in [place] [address] and tell tester they are having a hospital procedure, not having symptoms. Patient agrees to quarantine between testing and procedure on 08/03/19. Instructed patient that pre-instructions on previous letter are the same reference meds and NPO.

## 2019-07-15 ENCOUNTER — Telehealth: Payer: Self-pay | Admitting: Gastroenterology

## 2019-07-15 NOTE — Telephone Encounter (Signed)
Called patient back and left message that the quarantine is just for her

## 2019-07-30 ENCOUNTER — Other Ambulatory Visit (HOSPITAL_COMMUNITY)
Admission: RE | Admit: 2019-07-30 | Discharge: 2019-07-30 | Disposition: A | Discharge status: Home / Self Care | Payer: BC Managed Care – PPO | Source: Ambulatory Visit | Attending: Gastroenterology | Admitting: Gastroenterology

## 2019-07-30 DIAGNOSIS — Z01812 Encounter for preprocedural laboratory examination: Secondary | ICD-10-CM | POA: Insufficient documentation

## 2019-07-30 DIAGNOSIS — Z20828 Contact with and (suspected) exposure to other viral communicable diseases: Secondary | ICD-10-CM | POA: Insufficient documentation

## 2019-07-31 LAB — NOVEL CORONAVIRUS, NAA (HOSP ORDER, SEND-OUT TO REF LAB; TAT 18-24 HRS): SARS-CoV-2, NAA: NOT DETECTED

## 2019-08-03 ENCOUNTER — Encounter (HOSPITAL_COMMUNITY)
Admission: RE | Disposition: A | Discharge status: Home / Self Care | Payer: Self-pay | Source: Home / Self Care | Attending: Gastroenterology

## 2019-08-03 ENCOUNTER — Ambulatory Visit (HOSPITAL_COMMUNITY)
Admission: RE | Admit: 2019-08-03 | Discharge: 2019-08-03 | Disposition: A | Discharge status: Home / Self Care | Payer: BC Managed Care – PPO | Attending: Gastroenterology | Admitting: Gastroenterology

## 2019-08-03 DIAGNOSIS — R1310 Dysphagia, unspecified: Secondary | ICD-10-CM | POA: Diagnosis not present

## 2019-08-03 DIAGNOSIS — R1110 Vomiting, unspecified: Secondary | ICD-10-CM | POA: Diagnosis not present

## 2019-08-03 DIAGNOSIS — Z5309 Procedure and treatment not carried out because of other contraindication: Secondary | ICD-10-CM | POA: Diagnosis not present

## 2019-08-03 DIAGNOSIS — K219 Gastro-esophageal reflux disease without esophagitis: Secondary | ICD-10-CM | POA: Diagnosis not present

## 2019-08-03 HISTORY — PX: ESOPHAGEAL MANOMETRY: SHX5429

## 2019-08-03 SURGERY — MANOMETRY, ESOPHAGUS

## 2019-08-03 MED ORDER — LIDOCAINE VISCOUS HCL 2 % MT SOLN
OROMUCOSAL | Status: AC
  Filled 2019-08-03: qty 15

## 2019-08-03 SURGICAL SUPPLY — 2 items
FACESHIELD LNG OPTICON STERILE (SAFETY) IMPLANT
GLOVE BIO SURGEON STRL SZ8 (GLOVE) ×6 IMPLANT

## 2019-08-03 NOTE — Progress Notes (Signed)
Attempted esophageal manometry.  Patient unable to tolerate.  Uncontrolable vomiting. Probe was removed.  Also tried cetacaine with second nurse present.  Patient still unable to tolerate.  Probe was removed and procedure aborted.

## 2019-08-06 ENCOUNTER — Encounter (HOSPITAL_COMMUNITY): Payer: Self-pay | Admitting: Gastroenterology

## 2019-08-10 DIAGNOSIS — R1310 Dysphagia, unspecified: Secondary | ICD-10-CM

## 2019-08-12 ENCOUNTER — Other Ambulatory Visit: Payer: Self-pay

## 2019-08-12 ENCOUNTER — Ambulatory Visit
Admission: RE | Admit: 2019-08-12 | Discharge: 2019-08-12 | Disposition: A | Discharge status: Home / Self Care | Payer: BC Managed Care – PPO | Source: Ambulatory Visit | Attending: Family Medicine | Admitting: Family Medicine

## 2019-08-12 DIAGNOSIS — Z1231 Encounter for screening mammogram for malignant neoplasm of breast: Secondary | ICD-10-CM

## 2019-09-02 ENCOUNTER — Telehealth: Payer: Self-pay | Admitting: Gastroenterology

## 2019-09-02 NOTE — Telephone Encounter (Signed)
Patient called and said she was not able to tolerate the esophageal manometry on 08/03/19, they had to abort it. And was told someone would be calling her. Please advise

## 2019-09-02 NOTE — Telephone Encounter (Signed)
Pt stated that she "was scheduled for a hospital procedure that had to be stopped."  She was expecting a call back.

## 2019-09-03 ENCOUNTER — Telehealth: Payer: Self-pay | Admitting: Gastroenterology

## 2019-09-03 ENCOUNTER — Other Ambulatory Visit: Payer: Self-pay

## 2019-09-03 DIAGNOSIS — R1310 Dysphagia, unspecified: Secondary | ICD-10-CM

## 2019-09-03 NOTE — Telephone Encounter (Signed)
See phone note

## 2019-09-03 NOTE — Telephone Encounter (Signed)
Sorry to hear she did not tolerate the ph / manometry. If her symptoms persist and she is willing, let's proceed with a barium swallow with tablet study which may help provide some information. I would also like her to see me at the office for a clinic visit to discuss results and plan moving forward. Can you help coordinate both? Thanks

## 2019-09-03 NOTE — Telephone Encounter (Signed)
Thanks Esther 

## 2019-09-03 NOTE — Telephone Encounter (Signed)
Patient agreed to do Barium swallow study with tab. Scheduled at [facility] on 09/09/19 patient to arrive at 10:45am and be NPO 3 hours before. No office appts available with Dr. Havery Moros in Nov. Will put on Dec. Schedule when it is out

## 2019-09-09 ENCOUNTER — Ambulatory Visit (HOSPITAL_COMMUNITY)
Admission: RE | Admit: 2019-09-09 | Discharge: 2019-09-09 | Disposition: A | Discharge status: Home / Self Care | Payer: BC Managed Care – PPO | Source: Ambulatory Visit | Attending: Gastroenterology | Admitting: Gastroenterology

## 2019-09-09 ENCOUNTER — Other Ambulatory Visit: Payer: Self-pay

## 2019-09-09 DIAGNOSIS — R1310 Dysphagia, unspecified: Secondary | ICD-10-CM

## 2019-09-17 ENCOUNTER — Telehealth: Payer: Self-pay | Admitting: General Surgery

## 2019-09-17 ENCOUNTER — Other Ambulatory Visit: Payer: Self-pay

## 2019-09-17 ENCOUNTER — Encounter: Payer: Self-pay | Admitting: Gastroenterology

## 2019-09-17 ENCOUNTER — Ambulatory Visit: Payer: BC Managed Care – PPO | Admitting: Gastroenterology

## 2019-09-17 VITALS — BP 124/80 | HR 70 | Temp 97.9°F | Ht 66.0 in | Wt 181.4 lb

## 2019-09-17 DIAGNOSIS — R1310 Dysphagia, unspecified: Secondary | ICD-10-CM | POA: Diagnosis not present

## 2019-09-17 DIAGNOSIS — R0789 Other chest pain: Secondary | ICD-10-CM

## 2019-09-17 DIAGNOSIS — R0989 Other specified symptoms and signs involving the circulatory and respiratory systems: Secondary | ICD-10-CM

## 2019-09-17 DIAGNOSIS — K219 Gastro-esophageal reflux disease without esophagitis: Secondary | ICD-10-CM

## 2019-09-17 DIAGNOSIS — F411 Generalized anxiety disorder: Secondary | ICD-10-CM

## 2019-09-17 NOTE — Patient Instructions (Signed)
If you are age 61 or older, your body mass index should be between 23-30. Your Body mass index is 29.28 kg/m. If this is out of the aforementioned range listed, please consider follow up with your Primary Care Provider.  If you are age 44 or younger, your body mass index should be between 19-25. Your Body mass index is 29.28 kg/m. If this is out of the aformentioned range listed, please consider follow up with your Primary Care Provider.   We have sent referrals to Cardiology and ENT, they will contact you to schedule an appointment. If you have not heard from them within 2 weeks please let us know so that we may follow up.  Thank you for entrusting me with your care and for choosing [facility] Dr. [place] Cellar

## 2019-09-17 NOTE — Progress Notes (Signed)
error 

## 2019-09-17 NOTE — Progress Notes (Signed)
HPI :  63 year old female here for follow-up visit.  She has previously been followed for reflux and dysphagia as well as globus and chest discomfort.  She had an EGD with dilation of possible UES stricture in 2018 and had some benefit from that exam initially.  She unfortunately has had recurrence of some periodic dysphagia since then as well as chest symptoms, belching, and globus.  She is remained on Protonix 40 mg twice a day and using Carafate as needed.  She had a repeat EGD in July of this year with empiric dilation to 18 mm.  Biopsies of the gastric polyp and stomach otherwise normal.  She states she unfortunately did not get much relief from her symptoms with the dilation at the time.  Her symptoms have essentially persisted as outlined below.  Her main issue is sense of globus.  She feels that there is something stuck in her throat all the time.  She is not having much dysphagia anymore, states she perhaps will have occasional bread get hung up but for the most part the symptoms are mild and not having much difficulty swallowing.  She denies any cough.  She does have occasional voice changes and hoarseness.  She denies any nausea or vomiting, no abdominal pains.  She does have discomfort in her mid chest at times.  She previously thought this was related to reflux but now thinks it could be related to anxiety.  She states she does deep breathing and uses lavender oil which tends to relax her and make this go away.  This can occur at any time without any clear triggers, not necessarily related to eating.  She does have occasional belching, but specifically denies pyrosis and regurgitation.  She denies any shortness of breath, no radiation of the pains.  She denies any exertional component.  She has had a prior EKG in June but no prior cardiac testing.  She denies any family history of coronary disease.  I had previously ordered her manometry and pH study in light of ongoing symptoms but she  could not tolerate it and it was not done.  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  She continues to take her Protonix at 40 mg twice daily and Carafate as needed.  She previously was on omeprazole and states that had not helped as much, initially the Protonix was working but states her symptoms have largely persisted.  She has never had a laryngoscopy.  No prior tobacco use  She does endorse feeling quite anxious in recent months in light of the Covid epidemic, her job, the election, etc.  She denies feelings of depression.  She is interested in starting an SSRI as she thinks it may be related to some of her other symptoms and is waiting to see her primary care physician about this.   Prior workup: EGD 07/11/2017 - 2cm HH, normal esophagus, biopsies taken to rule out EoE, empiric dilation done to 30mm with small wrent at UES, mild gastritis  [place] RUQ 05/13/19 - normal  Colonoscopy 04/25/2010 - normal  EGD 06/10/19 - 2cm HH, nl esophagus, empiric dilation performed, benign gastric polyps, H pylori negative, normal duodenum.   Barium swallow 09/09/19 - no stricture, nonspecific dysmotility      Past Medical History:  Diagnosis Date  . Allergy   . Common migraine with intractable migraine 04/22/2018  . GERD (gastroesophageal reflux disease)   . Hyperlipidemia   .  Migraine headache      Past Surgical History:  Procedure Laterality Date  . ESOPHAGEAL MANOMETRY N/A 08/03/2019   Procedure: ESOPHAGEAL MANOMETRY (EM);  Surgeon: Yetta Flock, MD;  Location: [facility];  Service: Gastroenterology;  Laterality: N/A;  Aborted, pt unable to tolerate  . right ankle surgery w/plate and screws  075-GRM  . TONSILLECTOMY AND ADENOIDECTOMY     Family History  Problem Relation Age of Onset  . Stroke Mother   . Migraines Mother   . Dementia Father   . Migraines Sister   . Colon cancer Neg Hx   .  Esophageal cancer Neg Hx   . Pancreatic cancer Neg Hx   . Liver cancer Neg Hx   . Rectal cancer Neg Hx   . Stomach cancer Neg Hx    Social History   Tobacco Use  . Smoking status: Never Smoker  . Smokeless tobacco: Never Used  Substance Use Topics  . Alcohol use: Yes    Alcohol/week: 5.0 standard drinks    Types: 5 Glasses of wine per week  . Drug use: No   Current Outpatient Medications  Medication Sig Dispense Refill  . calcium carbonate (OS-CAL) 600 MG TABS tablet Take 1 tablet by mouth daily.    . cholecalciferol (VITAMIN D) 1000 units tablet Take 1 tablet by mouth daily.    . pantoprazole (PROTONIX) 40 MG tablet Take 40 mg by mouth 2 (two) times daily.    . pravastatin (PRAVACHOL) 20 MG tablet Take 20 mg by mouth daily.    . rizatriptan (MAXALT-MLT) 10 MG disintegrating tablet Take 10 mg by mouth as needed.    . sucralfate (CARAFATE) 1 g tablet Take 1 tablet (1 g total) by mouth every 6 (six) hours as needed. Slowly dissolve tablet in 1 Tablespoon of distilled water for about 15 minutes prior to ingestion. 270 tablet 3   No current facility-administered medications for this visit.    Allergies  Allergen Reactions  . Bee Venom Anaphylaxis  . Metronidazole Nausea And Vomiting  . Cefuroxime Axetil Nausea And Vomiting  . Sulfonamide Derivatives     REACTION: rash     Review of Systems: All systems reviewed and negative except where noted in HPI.    Dg Esophagus W Double Cm (hd)  Result Date: 09/09/2019 CLINICAL DATA:  Dysphagia.  Reflux.  Recent esophageal dilatation. EXAM: ESOPHOGRAM / BARIUM SWALLOW / BARIUM TABLET STUDY TECHNIQUE: Combined double contrast and single contrast examination performed using effervescent crystals, thick barium liquid, and thin barium liquid. The patient was observed with fluoroscopy swallowing a 13 mm barium sulphate tablet. FLUOROSCOPY TIME:  Fluoroscopy Time:  1 minutes 30 seconds Radiation Exposure Index (if provided by the fluoroscopic  device): 31.8 Number of Acquired Spot Images: 0 COMPARISON:  None. FINDINGS: Fluoroscopic evaluation of swallowing demonstrates disruption of 3/3 primary esophageal peristaltic waves. No fixed stricture, fold thickening or mass. No reflux with the water siphon maneuver. The patient swallowed a 13 mm barium tablet which freely passed into the stomach. IMPRESSION: Nonspecific esophageal motility disorder.  No fixed stricture. Electronically Signed   By: Rolm Baptise M.D.   On: 09/09/2019 11:33   Lab Results  Component Value Date   WBC 6.7 04/20/2019   HGB 14.3 04/20/2019   HCT 44.2 04/20/2019   MCV 91.1 04/20/2019   PLT 258 04/20/2019    Lab Results  Component Value Date   CREATININE 0.71 04/20/2019   BUN 11 04/20/2019   NA 141 04/20/2019  K 4.0 04/20/2019   CL 107 04/20/2019   CO2 20 (L) 04/20/2019    Lab Results  Component Value Date   ALT 16 04/20/2019   AST 24 04/20/2019   ALKPHOS 73 04/20/2019   BILITOT 0.6 04/20/2019      Physical Exam: BP 124/80   Pulse 70   Temp 97.9 F (36.6 C)   Ht 5\' 6"  (1.676 m)   Wt 181 lb 6.4 oz (82.3 kg)   BMI 29.28 kg/m  Constitutional: Pleasant,well-developed, female in no acute distress. HEENT: Normocephalic and atraumatic. Conjunctivae are normal. No scleral icterus. Neck supple.  Cardiovascular: Normal rate, regular rhythm.  Pulmonary/chest: Effort normal and breath sounds normal. No wheezing, rales or rhonchi. Abdominal: Soft, nondistended, nontender. There are no masses palpable. No hepatomegaly. Extremities: no edema Lymphadenopathy: No cervical adenopathy noted. Neurological: Alert and oriented to person place and time. Skin: Skin is warm and dry. No rashes noted. Psychiatric: Normal mood and affect. Behavior is normal.   ASSESSMENT AND PLAN: 63 year old female here for reassessment of the following:  Globus / atypical chest pain / dysphagia / GERD - history as outlined above, unfortunately she has persistent symptoms  despite Protonix 40 mg twice a day and Carafate, this led to the question of whether or not reflux is driving this process.  Ideally a 24-hour pH study would be most useful to objectively clarify what is driving this, unfortunately she was not able to tolerate this attempt.  Her barium swallow does show a nonspecific dysmotility disorder which can be related to her dysphagia and possibly globus, however not able tomore formally evaluate with manometry.  We discussed what a motility disorder is, she needs to chew food well and take her time, we don't have a specific medication I can give her for this, and prior dilation did not help much. Otherwise given persistent globus and voice change, will refer to ENT for laryngoscopy to ensure nothing else going on.  While it is quite possible her chest symptoms could be related to anxiety as she personally feels it is, I do think cardiac Featherly-up is reasonable given her intermittent chest pains and age to ensure that is not a component to this as well.  I referred her to cardiology.  Discussed starting her on a regimen for anxiety such as an SSRI, she is quite interested in this and wants to pursue that with her primary care physician.  Anxiety - patient will see her primary care to discuss management of this long-term, quite possible it is related to some of her symptoms as above.  [place] Cellar, MD [facility]

## 2019-09-17 NOTE — Telephone Encounter (Signed)
[facility] to [facility] fax# [phone] phone [phone]

## 2019-09-21 NOTE — Telephone Encounter (Signed)
Called [facility]. They do not see the referral we sent.  Asked that we resend to a different fax number: [phone].  They indicated they will contact the pt this week to schedule once they receive the referral.

## 2019-09-25 NOTE — Telephone Encounter (Signed)
Called [facility].  They have not scheduled an appt yet but will let us know when she does. The person who usually does that is out of the office.  They will let us know when she is scheduled.

## 2019-10-01 NOTE — Telephone Encounter (Signed)
Dr. Loni Muse - fyi - You referred pt to ENT.  She saw Dr. Redmond Baseman at [facility] yesterday. The note is in [place] for your review.

## 2019-10-16 ENCOUNTER — Encounter: Payer: Self-pay | Admitting: Cardiology

## 2019-10-16 ENCOUNTER — Ambulatory Visit: Payer: BC Managed Care – PPO | Admitting: Cardiology

## 2019-10-16 ENCOUNTER — Other Ambulatory Visit: Payer: Self-pay

## 2019-10-16 DIAGNOSIS — R072 Precordial pain: Secondary | ICD-10-CM

## 2019-10-16 MED ORDER — METOPROLOL TARTRATE 100 MG PO TABS: 100.0000 mg | ORAL_TABLET | Freq: Once | ORAL | 0 refills | Status: DC

## 2019-10-16 NOTE — Progress Notes (Signed)
Cardiology Office Consult Note    Date:  10/16/2019   ID:  Katherine Crosby, DOB April 26, 1956, MRN [id number]  PCP:  Caren Macadam, MD  Cardiologist:  Fransico Him, MD  (NEW)  Chief Complaint  Patient presents with  . Chest Pain    History of Present Illness:  Katherine Crosby is a 63 y.o. female who is being seen today for the evaluation of Chest pain at the request of Armbruster, Carlota Raspberry, MD.  This is a 63yo female with a hx of HLD and GERD who is referred for evaluation of CP. She is followed by Gi for reflux and dysphagia as well as globus and chest discomfort.  She had an EGD with dilation of possible UES stricture in 2018.  She has been on Protonix BID. She had a repeat EGD with dilation in July 2020 but continued to have CP and a feeling like something is stuck in her throat.  She has continued to have CP that she thinks is related to stress and feels she has to take a deep breath breathing lavender oil which makes it go away.    Her CP is non exertional and not associated with SOB, N, diaphoresis.  It can occur at anytime of the day or night.  There is no relation to eating.  She denies any SOB, DOE, PND, orthopenea, LE edema or syncope. There is no family hx of CAD.  She has never smoked.  She is now referred by GI to evaluate CP further to make sure there is no cardiac etiology   Past Medical History:  Diagnosis Date  . Allergy   . Common migraine with intractable migraine 04/22/2018  . GERD (gastroesophageal reflux disease)   . Hyperlipidemia   . Migraine headache     Past Surgical History:  Procedure Laterality Date  . ESOPHAGEAL MANOMETRY N/A 08/03/2019   Procedure: ESOPHAGEAL MANOMETRY (EM);  Surgeon: Yetta Flock, MD;  Location: [facility];  Service: Gastroenterology;  Laterality: N/A;  Aborted, pt unable to tolerate  . right ankle surgery w/plate and screws  075-GRM  . TONSILLECTOMY AND ADENOIDECTOMY      Current Medications: Current Meds   Medication Sig  . calcium carbonate (OS-CAL) 600 MG TABS tablet Take 1 tablet by mouth daily.  . cholecalciferol (VITAMIN D) 1000 units tablet Take 1 tablet by mouth daily.  . pantoprazole (PROTONIX) 40 MG tablet Take 40 mg by mouth 2 (two) times daily.  . pravastatin (PRAVACHOL) 20 MG tablet Take 20 mg by mouth daily.  . rizatriptan (MAXALT-MLT) 10 MG disintegrating tablet Take 10 mg by mouth as needed.    Allergies:   Bee venom, Metronidazole, Cefuroxime axetil, and Sulfonamide derivatives   Social History   Socioeconomic History  . Marital status: Single    Spouse name: Not on file  . Number of children: 0  . Years of education: Masters  . Highest education level: Master's degree (e.g., MA, MS, MEng, MEd, MSW, MBA)  Occupational History  . Occupation: [employer]  Social Needs  . Financial resource strain: Not on file  . Food insecurity    Worry: Not on file    Inability: Not on file  . Transportation needs    Medical: Not on file    Non-medical: Not on file  Tobacco Use  . Smoking status: Never Smoker  . Smokeless tobacco: Never Used  Substance and Sexual Activity  . Alcohol use: Yes    Alcohol/week: 5.0 standard drinks  Types: 5 Glasses of wine per week  . Drug use: No  . Sexual activity: Not Currently  Lifestyle  . Physical activity    Days per week: Not on file    Minutes per session: Not on file  . Stress: Not on file  Relationships  . Social Herbalist on phone: Not on file    Gets together: Not on file    Attends religious service: Not on file    Active member of club or organization: Not on file    Attends meetings of clubs or organizations: Not on file    Relationship status: Not on file  Other Topics Concern  . Not on file  Social History Narrative   Lives w/ Florina Ou.   Caffeine use: 1 cup tea daily   Rare soda   Right handed       Patient lives in a one level home, 3 steps to enter. She walks 5 days a week for exercise.     Family  History:  The patient's family history includes Dementia in her father; Migraines in her mother and sister; Stroke in her mother.   ROS:   Please see the history of present illness.    ROS All other systems reviewed and are negative.  No flowsheet data found.     PHYSICAL EXAM:   VS:  BP (!) 150/98   Pulse 71   Ht 5\' 6"  (1.676 m)   Wt 185 lb 6.4 oz (84.1 kg)   BMI 29.92 kg/m    GEN: Well nourished, well developed, in no acute distress  HEENT: normal  Neck: no JVD, carotid bruits, or masses Cardiac: RRR; no murmurs, rubs, or gallops,no edema.  Intact distal pulses bilaterally.  Respiratory:  clear to auscultation bilaterally, normal [place] of breathing GI: soft, nontender, nondistended, + BS MS: no deformity or atrophy  Skin: warm and dry, no rash Neuro:  Alert and Oriented x 3, Strength and sensation are intact Psych: euthymic mood, full affect  Wt Readings from Last 3 Encounters:  10/16/19 185 lb 6.4 oz (84.1 kg)  09/17/19 181 lb 6.4 oz (82.3 kg)  06/10/19 180 lb (81.6 kg)      Studies/Labs Reviewed:   EKG:  EKG is ordered today.  The ekg ordered today demonstrates NSR with no ST changes  Recent Labs: 04/20/2019: ALT 16; BUN 11; Creatinine, Ser 0.71; Hemoglobin 14.3; Platelets 258; Potassium 4.0; Sodium 141   Lipid Panel No results found for: CHOL, TRIG, HDL, CHOLHDL, VLDL, LDLCALC, LDLDIRECT  Additional studies/ records that were reviewed today include:  Office notes from PCP    ASSESSMENT:    1. Precordial pain      PLAN:  In order of problems listed above:  1. Atypical CP -her CP is atypical and EKG is normal -her CRFs include HLD and postmenopausal state -I have recommended a coronary CTA for further risk assessment -check 2D echo to rule out structural heart disease    Medication Adjustments/Labs and Tests Ordered: Current medicines are reviewed at length with the patient today.  Concerns regarding medicines are outlined above.  Medication  changes, Labs and Tests ordered today are listed in the Patient Instructions below.  There are no Patient Instructions on file for this visit.   Signed, Fransico Him, MD  10/16/2019 9:14 AM    [facility] [place], [place], [place]  [address] Phone: [phone]; Fax: [phone]

## 2019-10-16 NOTE — Patient Instructions (Addendum)
Medication Instructions:  Your physician recommends that you continue on your current medications as directed. Please refer to the Current Medication list given to you today.  *If you need a refill on your cardiac medications before your next appointment, please call your pharmacy*  Lab Kaczynski: You will need to have a BMET drawn prior to your CT.   If you have labs (blood [place]) drawn today and your tests are completely normal, you will receive your results only by: Marland Kitchen MyChart Message (if you have MyChart) OR . A paper copy in the mail If you have any lab test that is abnormal or we need to change your treatment, we will call you to review the results.  Testing/Procedures: Your physician has requested that you have an echocardiogram. Echocardiography is a painless test that uses sound waves to create images of your heart. It provides your doctor with information about the size and shape of your heart and how well your heart's chambers and valves are working. This procedure takes approximately one hour. There are no restrictions for this procedure.   Follow-Up: At [facility], you and your health needs are our priority.  As part of our continuing mission to provide you with exceptional heart care, we have created designated Provider Care Teams.  These Care Teams include your primary Cardiologist (physician) and Advanced Practice Providers (APPs -  Physician Assistants and Nurse Practitioners) who all [place] together to provide you with the care you need, when you need it.  Follow-up with Dr. Radford Pax as needed based on results of testing.     Other Instructions Your cardiac CT will be scheduled at one of the below locations:   [facility] [address] [place], [place] [address] [phone]  [place] [address] [place], [place] [address] [phone]  If scheduled at [facility], please arrive at the [facility] main entrance of [facility] 30-45 minutes prior to test start time. Proceed to the [facility] Radiology Department (first floor) to check-in and test prep.  If scheduled at [facility], please arrive 15 mins early for check-in and test prep.  Please follow these instructions carefully (unless otherwise directed):  On the Night Before the Test: . Be sure to Drink plenty of water. . Do not consume any caffeinated/decaffeinated beverages or chocolate 12 hours prior to your test. . Do not take any antihistamines 12 hours prior to your test.  On the Day of the Test: . Drink plenty of water. Do not drink any water within one hour of the test. . Do not eat any food 4 hours prior to the test. . You may take your regular medications prior to the test.  . Take metoprolol (Lopressor) two hours prior to test. . FEMALES- please wear underwire-free bra if available       After the Test: . Drink plenty of water. . After receiving IV contrast, you may experience a mild flushed feeling. This is normal. . On occasion, you may experience a mild rash up to 24 hours after the test. This is not dangerous. If this occurs, you can take Benadryl 25 mg and increase your fluid intake. . If you experience trouble breathing, this can be serious. If it is severe call 911 IMMEDIATELY. If it is mild, please call our office.  Once we have confirmed authorization from your insurance company, we will call you to set up a date and  time for your test.   For non-scheduling related questions, please contact the cardiac imaging nurse navigator should you have any questions/concerns: Marchia Bond, RN Navigator Cardiac Imaging [facility] [phone] Office

## 2019-10-19 ENCOUNTER — Other Ambulatory Visit: Payer: Self-pay

## 2019-10-19 DIAGNOSIS — Z20822 Contact with and (suspected) exposure to covid-19: Secondary | ICD-10-CM

## 2019-10-21 LAB — NOVEL CORONAVIRUS, NAA: SARS-CoV-2, NAA: NOT DETECTED

## 2019-10-22 ENCOUNTER — Other Ambulatory Visit (HOSPITAL_COMMUNITY): Payer: BC Managed Care – PPO

## 2019-11-16 ENCOUNTER — Ambulatory Visit (HOSPITAL_COMMUNITY): Payer: BC Managed Care – PPO | Attending: Internal Medicine

## 2019-11-16 ENCOUNTER — Other Ambulatory Visit: Payer: Self-pay

## 2019-11-16 DIAGNOSIS — R072 Precordial pain: Secondary | ICD-10-CM | POA: Insufficient documentation

## 2019-12-01 ENCOUNTER — Other Ambulatory Visit: Payer: Self-pay

## 2019-12-01 ENCOUNTER — Encounter (HOSPITAL_COMMUNITY): Payer: Self-pay

## 2019-12-01 ENCOUNTER — Other Ambulatory Visit: Payer: BC Managed Care – PPO

## 2019-12-01 DIAGNOSIS — R072 Precordial pain: Secondary | ICD-10-CM

## 2019-12-01 LAB — BASIC METABOLIC PANEL
BUN/Creatinine Ratio: 21 (ref 12–28)
BUN: 15 mg/dL (ref 8–27)
CO2: 25 mmol/L (ref 20–29)
Calcium: 9.4 mg/dL (ref 8.7–10.3)
Chloride: 101 mmol/L (ref 96–106)
Creatinine, Ser: 0.7 mg/dL (ref 0.57–1.00)
GFR calc Af Amer: 108 mL/min/{1.73_m2} (ref >59)
GFR calc non Af Amer: 94 mL/min/{1.73_m2} (ref >59)
Glucose: 94 mg/dL (ref 65–99)
Potassium: 4.2 mmol/L (ref 3.5–5.2)
Sodium: 140 mmol/L (ref 134–144)

## 2019-12-02 ENCOUNTER — Telehealth (HOSPITAL_COMMUNITY): Payer: Self-pay | Admitting: Emergency Medicine

## 2019-12-02 NOTE — Telephone Encounter (Signed)
Reaching out to patient to offer assistance regarding upcoming cardiac imaging study; pt verbalizes understanding of appt date/time, parking situation and where to check in, pre-test NPO status and medications ordered, and verified current allergies; name and call back number provided for further questions should they arise Lekendrick Alpern RN Navigator Cardiac Imaging [facility] [phone] office [phone] cell 

## 2019-12-03 ENCOUNTER — Ambulatory Visit (HOSPITAL_COMMUNITY)
Admission: RE | Admit: 2019-12-03 | Discharge: 2019-12-03 | Disposition: A | Discharge status: Home / Self Care | Payer: BC Managed Care – PPO | Source: Ambulatory Visit | Attending: Cardiology | Admitting: Cardiology

## 2019-12-03 ENCOUNTER — Other Ambulatory Visit: Payer: Self-pay

## 2019-12-03 DIAGNOSIS — R072 Precordial pain: Secondary | ICD-10-CM | POA: Diagnosis present

## 2019-12-03 MED ORDER — NITROGLYCERIN 0.4 MG SL SUBL
0.8000 mg | SUBLINGUAL_TABLET | Freq: Once | SUBLINGUAL | Status: AC
  Administered 2019-12-03: 08:00:00 0.8 mg via SUBLINGUAL

## 2019-12-03 MED ORDER — NITROGLYCERIN 0.4 MG SL SUBL
SUBLINGUAL_TABLET | SUBLINGUAL | Status: AC
  Filled 2019-12-03: qty 2

## 2019-12-03 MED ORDER — IOHEXOL 350 MG/ML SOLN
80.0000 mL | Freq: Once | INTRAVENOUS | Status: AC | PRN
  Administered 2019-12-03: 80 mL via INTRAVENOUS

## 2020-06-22 ENCOUNTER — Encounter: Payer: Self-pay | Admitting: Gastroenterology

## 2020-06-22 ENCOUNTER — Ambulatory Visit (AMBULATORY_SURGERY_CENTER): Payer: Self-pay | Admitting: *Deleted

## 2020-06-22 ENCOUNTER — Other Ambulatory Visit: Payer: Self-pay

## 2020-06-22 VITALS — Ht 66.0 in | Wt 181.0 lb

## 2020-06-22 DIAGNOSIS — Z1211 Encounter for screening for malignant neoplasm of colon: Secondary | ICD-10-CM

## 2020-06-22 MED ORDER — CLENPIQ 10-3.5-12 MG-GM -GM/160ML PO SOLN: 1.0000 | ORAL | 0 refills | Status: DC

## 2020-06-22 NOTE — Progress Notes (Signed)
No egg or soy allergy known to patient  No issues with past sedation with any surgeries or procedures no intubation problems in the past  No FH of Malignant Hyperthermia No diet pills per patient No home 02 use per patient  No blood thinners per patient  Pt denies issues with constipation  No A fib or A flutter  COVID 19 guidelines implemented in PV today with Pt and RN   Clenpiq Coupon given to pt in [facility] today , - pt cannot swallow pills for Sutab, needs low volume per pt   Due to the COVID-19 pandemic we are asking patients to follow these guidelines. Please only bring one care partner. Please be aware that your care partner may wait in the car in the parking lot or if they feel like they will be too hot to wait in the car, they may wait in the lobby on the 4th floor. All care partners are required to wear a mask the entire time (we do not have any that we can provide them), they need to practice social distancing, and we will do a Covid check for all patient's and care partners when you arrive. Also we will check their temperature and your temperature. If the care partner waits in their car they need to stay in the parking lot the entire time and we will call them on their cell phone when the patient is ready for discharge so they can bring the car to the front of the building. Also all patient's will need to wear a mask into building.

## 2020-06-30 ENCOUNTER — Ambulatory Visit (AMBULATORY_SURGERY_CENTER): Payer: BC Managed Care – PPO | Admitting: Gastroenterology

## 2020-06-30 ENCOUNTER — Other Ambulatory Visit: Payer: Self-pay

## 2020-06-30 ENCOUNTER — Encounter: Payer: Self-pay | Admitting: Gastroenterology

## 2020-06-30 VITALS — BP 132/75 | HR 65 | Temp 98.0°F | Resp 15 | Ht 66.0 in | Wt 181.0 lb

## 2020-06-30 DIAGNOSIS — K635 Polyp of colon: Secondary | ICD-10-CM

## 2020-06-30 DIAGNOSIS — D125 Benign neoplasm of sigmoid colon: Secondary | ICD-10-CM

## 2020-06-30 DIAGNOSIS — Z1211 Encounter for screening for malignant neoplasm of colon: Secondary | ICD-10-CM

## 2020-06-30 MED ORDER — SODIUM CHLORIDE 0.9 % IV SOLN: 500.0000 mL | Freq: Once | INTRAVENOUS | Status: AC

## 2020-06-30 NOTE — Progress Notes (Signed)
Report given to PACU, vss 

## 2020-06-30 NOTE — Op Note (Signed)
[facility] Patient Name: Katherine Crosby Procedure Date: 06/30/2020 11:32 AM MRN: [id number] Endoscopist: Remo Lipps P. Havery Moros , MD Age: 64 Referring MD:  Date of Birth: 1955-12-06 Gender: Female Account #: [id number] Procedure:                Colonoscopy Indications:              Screening for colorectal malignant neoplasm Medicines:                Monitored Anesthesia Care Procedure:                Pre-Anesthesia Assessment:                           - Prior to the procedure, a History and Physical                            was performed, and patient medications and                            allergies were reviewed. The patient's tolerance of                            previous anesthesia was also reviewed. The risks                            and benefits of the procedure and the sedation                            options and risks were discussed with the patient.                            All questions were answered, and informed consent                            was obtained. Prior Anticoagulants: The patient has                            taken no previous anticoagulant or antiplatelet                            agents. ASA Grade Assessment: II - A patient with                            mild systemic disease. After reviewing the risks                            and benefits, the patient was deemed in                            satisfactory condition to undergo the procedure.                           After obtaining informed consent, the colonoscope  was passed under direct vision. Throughout the                            procedure, the patient's blood pressure, pulse, and                            oxygen saturations were monitored continuously. The                            Colonoscope was introduced through the anus and                            advanced to the the cecum, identified by                            appendiceal orifice  and ileocecal valve. The                            colonoscopy was performed without difficulty. The                            patient tolerated the procedure well. The quality                            of the bowel preparation was good. The ileocecal                            valve, appendiceal orifice, and rectum were                            photographed. Scope In: 11:40:24 AM Scope Out: 11:55:50 AM Scope Withdrawal Time: 0 hours 13 minutes 29 seconds  Total Procedure Duration: 0 hours 15 minutes 26 seconds  Findings:                 The perianal and digital rectal examinations were                            normal.                           A 3 mm polyp was found in the sigmoid colon. The                            polyp was sessile. The polyp was removed with a                            cold snare. Resection and retrieval were complete.                           A few small-mouthed diverticula were found in the                            ascending colon.  Internal hemorrhoids were found during                            retroflexion. The hemorrhoids were small.                           The exam was otherwise without abnormality. Complications:            No immediate complications. Estimated blood loss:                            Minimal. Estimated Blood Loss:     Estimated blood loss was minimal. Impression:               - One 3 mm polyp in the sigmoid colon, removed with                            a cold snare. Resected and retrieved.                           - Diverticulosis in the ascending colon.                           - Internal hemorrhoids.                           - The examination was otherwise normal. Recommendation:           - Patient has a contact number available for                            emergencies. The signs and symptoms of potential                            delayed complications were discussed with the                             patient. Return to normal activities tomorrow.                            Written discharge instructions were provided to the                            patient.                           - Resume previous diet.                           - Continue present medications.                           - Await pathology results. Remo Lipps P. Affie Gasner, MD 06/30/2020 11:58:46 AM This report has been signed electronically.

## 2020-06-30 NOTE — Progress Notes (Signed)
Pt's states no medical or surgical changes since previsit or office visit. 

## 2020-06-30 NOTE — Progress Notes (Signed)
Called to room to assist during endoscopic procedure.  Patient ID and intended procedure confirmed with present staff. Received instructions for my participation in the procedure from the performing physician.  

## 2020-06-30 NOTE — Progress Notes (Signed)
VS taken by C.W. 

## 2020-06-30 NOTE — Patient Instructions (Signed)
YOU HAD AN ENDOSCOPIC PROCEDURE TODAY AT THE [facility]:   Refer to the procedure report that was given to you for any specific questions about what was found during the examination.  If the procedure report does not answer your questions, please call your gastroenterologist to clarify.  If you requested that your care partner not be given the details of your procedure findings, then the procedure report has been included in a sealed envelope for you to review at your convenience later.  YOU SHOULD EXPECT: Some feelings of bloating in the abdomen. Passage of more gas than usual.  Walking can help get rid of the air that was put into your GI tract during the procedure and reduce the bloating. If you had a lower endoscopy (such as a colonoscopy or flexible sigmoidoscopy) you may notice spotting of blood in your stool or on the toilet paper. If you underwent a bowel prep for your procedure, you may not have a normal bowel movement for a few days.  Please Note:  You might notice some irritation and congestion in your nose or some drainage.  This is from the oxygen used during your procedure.  There is no need for concern and it should clear up in a day or so.  SYMPTOMS TO REPORT IMMEDIATELY:   Following lower endoscopy (colonoscopy or flexible sigmoidoscopy):  Excessive amounts of blood in the stool  Significant tenderness or worsening of abdominal pains  Swelling of the abdomen that is new, acute  Fever of 100F or higher   For urgent or emergent issues, a gastroenterologist can be reached at any hour by calling [phone]. Do not use MyChart messaging for urgent concerns.    DIET:  We do recommend a small meal at first, but then you may proceed to your regular diet.  Drink plenty of fluids but you should avoid alcoholic beverages for 24 hours.  MEDICATIONS: Continue present medications.  Please see handouts given to you by your recovery nurse.  ACTIVITY:  You should plan to  take it easy for the rest of today and you should NOT DRIVE or use heavy machinery until tomorrow (because of the sedation medicines used during the test).    FOLLOW UP: Our staff will call the number listed on your records 48-72 hours following your procedure to check on you and address any questions or concerns that you may have regarding the information given to you following your procedure. If we do not reach you, we will leave a message.  We will attempt to reach you two times.  During this call, we will ask if you have developed any symptoms of COVID 19. If you develop any symptoms (ie: fever, flu-like symptoms, shortness of breath, cough etc.) before then, please call [phone].  If you test positive for Covid 19 in the 2 weeks post procedure, please call and report this information to us.    If any biopsies were taken you will be contacted by phone or by letter within the next 1-3 weeks.  Please call us at [phone] if you have not heard about the biopsies in 3 weeks.   Thank you for allowing us to provide for your healthcare needs today.   SIGNATURES/CONFIDENTIALITY: You and/or your care partner have signed paperwork which will be entered into your electronic medical record.  These signatures attest to the fact that that the information above on your After Visit Summary has been reviewed and is understood.  Full responsibility of the   confidentiality of this discharge information lies with you and/or your care-partner. 

## 2020-07-04 ENCOUNTER — Telehealth: Payer: Self-pay | Admitting: *Deleted

## 2020-07-04 NOTE — Telephone Encounter (Signed)
  Follow up Call-  Call back number 06/30/2020 06/10/2019  Post procedure Call Back phone  # [phone]  Permission to leave phone message Yes Yes  Some recent data might be hidden     Patient questions:  Do you have a fever, pain , or abdominal swelling? No. Pain Score  0 *  Have you tolerated food without any problems? Yes.    Have you been able to return to your normal activities? Yes.    Do you have any questions about your discharge instructions: Diet   No. Medications  No. Follow up visit  No.  Do you have questions or concerns about your Care? No.  Actions: * If pain score is 4 or above: 1. No action needed, pain <4.Have you developed a fever since your procedure? no  2.   Have you had an respiratory symptoms (SOB or cough) since your procedure? no  3.   Have you tested positive for COVID 19 since your procedure no  4.   Have you had any family members/close contacts diagnosed with the COVID 19 since your procedure?  no   If yes to any of these questions please route to Joylene John, RN and Joella Prince, RN

## 2020-11-23 ENCOUNTER — Other Ambulatory Visit: Payer: Self-pay | Admitting: Family Medicine

## 2020-11-23 ENCOUNTER — Ambulatory Visit
Admission: RE | Admit: 2020-11-23 | Discharge: 2020-11-23 | Disposition: A | Discharge status: Home / Self Care | Payer: BC Managed Care – PPO | Source: Ambulatory Visit | Attending: Family Medicine | Admitting: Family Medicine

## 2020-11-23 DIAGNOSIS — M79662 Pain in left lower leg: Secondary | ICD-10-CM

## 2021-02-07 ENCOUNTER — Other Ambulatory Visit: Payer: Self-pay | Admitting: Family Medicine

## 2021-02-07 DIAGNOSIS — Z1231 Encounter for screening mammogram for malignant neoplasm of breast: Secondary | ICD-10-CM

## 2021-02-10 DIAGNOSIS — R202 Paresthesia of skin: Secondary | ICD-10-CM

## 2021-02-10 HISTORY — DX: Paresthesia of skin: R20.2

## 2021-03-30 ENCOUNTER — Other Ambulatory Visit: Payer: Self-pay

## 2021-03-30 ENCOUNTER — Ambulatory Visit
Admission: RE | Admit: 2021-03-30 | Discharge: 2021-03-30 | Disposition: A | Discharge status: Home / Self Care | Payer: BC Managed Care – PPO | Source: Ambulatory Visit | Attending: Family Medicine | Admitting: Family Medicine

## 2021-03-30 DIAGNOSIS — Z1231 Encounter for screening mammogram for malignant neoplasm of breast: Secondary | ICD-10-CM

## 2021-06-24 ENCOUNTER — Other Ambulatory Visit: Payer: Self-pay | Admitting: Family Medicine

## 2021-06-24 DIAGNOSIS — H9190 Unspecified hearing loss, unspecified ear: Secondary | ICD-10-CM

## 2021-06-24 DIAGNOSIS — R269 Unspecified abnormalities of gait and mobility: Secondary | ICD-10-CM

## 2021-06-24 DIAGNOSIS — R413 Other amnesia: Secondary | ICD-10-CM

## 2021-06-24 DIAGNOSIS — R4789 Other speech disturbances: Secondary | ICD-10-CM

## 2021-07-03 NOTE — Progress Notes (Signed)
NEUROLOGY FOLLOW UP OFFICE NOTE  [name] UO:[id number]  Assessment/Plan:   Cognitive deficits Balance disorder - denies dizziness and pain, no obvious signs of myelopathy, neuropathy, objective limb weakness, or neurodegenerative disease such as Parkinson's  1  MRI of brain is scheduled 2  Neuropsychological testing 3  Follow up after testing.  Subjective:  Katherine Crosby is a 65 year old woman whom I previously saw for migraines presents today for memory and balance problems.  UPDATE: Last seen in July 2020.  Underwent workup for episode of amnesia/altered mental status.  EEG on 06/08/2019 was normal.  MRI of brain with and without contrast on 07/01/2019 personally reviewed showed minimal chronic small vessel ischemic changes within the cerebral white matter but nothing concerning.    She reports cognitive problems over the past 6 months.  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  When she returns to the office from the field and attempts to type up her notes, she struggles to figure out what to do, which was never a problem before.  She reports word-finding difficulty.  When driving, she sometimes may miss an exit on a familiar route.  She may be preoccupied because she is usually talking on the phone, but that was never previously an issue.  She reports a strong family history of dementia on her father's side, including her father and grandfather.  She also notes that her sister is displaying some memory issues as well.  B12 and thyroid studies reportedly normal.  Over the past year, she notes her balance is getting worse.  She has fallen on two occasions.  One time she tripped but she just feels off.  No associated dizziness.  She does note weakness in the legs.  No numbness in the feet.  No double vision.  She reports word-finding problems.  She says that her Driscoll is suffering.  She seesRecently finished physical therapy  for a cervical radiculopathy which helped.  Her PCP ordered a brain MRI scheduled for next week.  She states that her maternal aunt had longstanding history of balance problems that gradually progressed over the years.  She never saw a physician for evaluation.     HISTORY: She reports history of migraines since her 20s-30s.  They are severe sharp/squeezing left high parietal pain.  No preceding aura.  They typically last 3 hours and occur twice a month (typically wakes up with them).  Rarely may occur all day.  They are associated with nausea, sometimes vomiting and blurred vision.  No associated photophobia, phonophobia, or unilateral numbness and weakness.  They are triggered by seasonal allergies (she has "sinus headaches" in fall and spring that may aggravate migraine.  She treats with Advil Sinus to prevent migraine) and relieved by shower.   In 2019, she had vertigo.  MRI of brain performed on 05/07/18 was personally reviewed and demonstrated some mild bilateral frontal atrophy but otherwise unremarkable.  She still has some dizziness.   She presented to the ED on 04/20/19 for memory deficits.  She had a left sided migraine headache that day.  She went to bed and when she woke up later, she didn't know her birthday, her age, or why furniture was moved (they were making changes in the house).  She has only very brief memory of that time.  She had a very slight headache then.  The event lasted a couple of hours.  CT head was  personally reviewed and which again demonstrated stable bilateral frontal atrophy but otherwise no acute abnormalities. She was treated with headache cocktail and discharged home.   Current NSAIDS:  Advil Sinus (for "sinus headaches") Current analgesics:  none Current triptans:  Rizatriptan '10mg'$  Current ergotamine:  none Current anti-emetic:  none Current muscle relaxants:  none Current anti-anxiolytic:  none Current sleep aide:  none Current Antihypertensive medications:   none Current Antidepressant medications:  none Current Anticonvulsant medications:  none Current anti-CGRP:  none Current Vitamins/Herbal/Supplements:  none Current Antihistamines/Decongestants:  Os-Cal Other therapy:  none Hormone/birth control:  none   Past NSAIDS:  Ibuprofen  Past analgesics:  Tylenol Past abortive triptans:  none Past abortive ergotamine:  none Past muscle relaxants:  none Past anti-emetic:  Does not remember Past antihypertensive medications:  none Past antidepressant medications:  none Past anticonvulsant medications:  none Past anti-CGRP:  none Past vitamins/Herbal/Supplements:  none Past antihistamines/decongestants:  OTC Other past therapies:  none   Caffeine:  1 Dr. Malachi Bonds a day (small bottle) Diet:  Increased water intake Exercise:  Almost daily Depression:  no; Anxiety:  Yes (worried about job, going to grocery store, her mother's health all related to [place]) Other pain:  Joint pain Sleep hygiene:  poor Family history of headache:  Mother and sister (migraines), grandmother had headaches  PAST MEDICAL HISTORY: Past Medical History:  Diagnosis Date   Allergy    Anxiety    Common migraine with intractable migraine 04/22/2018   GERD (gastroesophageal reflux disease)    Hyperlipidemia    Migraine headache     MEDICATIONS: Current Outpatient Medications on File Prior to Visit  Medication Sig Dispense Refill   calcium carbonate (OS-CAL) 600 MG TABS tablet Take 1 tablet by mouth daily.     cholecalciferol (VITAMIN D) 1000 units tablet Take 1 tablet by mouth daily.     meloxicam (MOBIC) 15 MG tablet TK 1 T PO QD WAC (Patient not taking: Reported on 06/22/2020)     metoprolol tartrate (LOPRESSOR) 100 MG tablet Take 1 tablet (100 mg total) by mouth once for 1 dose. Take 1 tablet 2 hours prior to you CT. 1 tablet 0   Omega-3 Fatty Acids (FISH OIL) 1000 MG CAPS Take by mouth. (Patient not taking: Reported on 06/22/2020)     pantoprazole (PROTONIX) 40 MG  tablet Take 40 mg by mouth 2 (two) times daily.     pravastatin (PRAVACHOL) 20 MG tablet Take 20 mg by mouth daily.     rizatriptan (MAXALT-MLT) 10 MG disintegrating tablet Take 10 mg by mouth as needed.     Current Facility-Administered Medications on File Prior to Visit  Medication Dose Route Frequency Provider Last Rate Last Admin   0.9 %  sodium chloride infusion  500 mL Intravenous Once Armbruster, Carlota Raspberry, MD        ALLERGIES: Allergies  Allergen Reactions   Bee Venom Anaphylaxis and Swelling   Metronidazole Nausea And Vomiting   Cefuroxime Axetil Nausea And Vomiting   Sulfonamide Derivatives     REACTION: rash    FAMILY HISTORY: Family History  Problem Relation Age of Onset   Stroke Mother    Migraines Mother    Dementia Father    Migraines Sister    Colon cancer Neg Hx    Esophageal cancer Neg Hx    Pancreatic cancer Neg Hx    Liver cancer Neg Hx    Rectal cancer Neg Hx    Stomach cancer Neg Hx    Colon  polyps Neg Hx       Objective:  Blood pressure 126/77, pulse 72, height '5\' 6"'$  (1.676 m), weight 182 lb 6.4 oz (82.7 kg), SpO2 98 %. General: No acute distress.  Patient appears well-groomed.   Head:  Normocephalic/atraumatic Eyes:  Fundi examined but not visualized Neck: supple, no paraspinal tenderness, full range of motion Heart:  Regular rate and rhythm Lungs:  Clear to auscultation bilaterally Back: No paraspinal tenderness Neurological Exam:  [employer] Mental Exam 07/05/2021  Weekday Correct 1  Current year 1  What state are we in? 1  Amount spent 1  Amount left 2  # of Animals 3  5 objects recall 3  Number series 2  Hour markers 0  Time correct 2  Placed X in triangle correctly 1  Largest Figure 1  Name of female 2  Date back to [place] 2  Type of [place] 2  State she lived in 2  Total score 26   CN II-XII intact. Bulk and tone normal, muscle strength 5/5 throughout.  Sensation to light touch intact.  Deep tendon reflexes 2+  throughout, toes downgoing.  Finger to nose testing intact.  Broad-based gait, slightly unsteady.  Able to turn.  Unable to tandem walk.  Romberg with mild sway.   Metta Clines, DO  CC: Caren Macadam, MD

## 2021-07-05 ENCOUNTER — Encounter: Payer: Self-pay | Admitting: Neurology

## 2021-07-05 ENCOUNTER — Other Ambulatory Visit: Payer: Self-pay

## 2021-07-05 ENCOUNTER — Ambulatory Visit: Payer: BC Managed Care – PPO | Admitting: Neurology

## 2021-07-05 VITALS — BP 126/77 | HR 72 | Ht 66.0 in | Wt 182.4 lb

## 2021-07-05 DIAGNOSIS — R4189 Other symptoms and signs involving cognitive functions and awareness: Secondary | ICD-10-CM

## 2021-07-05 DIAGNOSIS — R2689 Other abnormalities of gait and mobility: Secondary | ICD-10-CM

## 2021-07-05 NOTE — Patient Instructions (Signed)
We will see what the MRI shows.  Once performed, have results sent to me Will order neuropsychological evaluation Follow up after testing

## 2021-07-13 ENCOUNTER — Ambulatory Visit
Admission: RE | Admit: 2021-07-13 | Discharge: 2021-07-13 | Disposition: A | Discharge status: Home / Self Care | Payer: BC Managed Care – PPO | Source: Ambulatory Visit | Attending: Family Medicine | Admitting: Family Medicine

## 2021-07-13 DIAGNOSIS — R4789 Other speech disturbances: Secondary | ICD-10-CM

## 2021-07-13 DIAGNOSIS — H9190 Unspecified hearing loss, unspecified ear: Secondary | ICD-10-CM

## 2021-07-13 DIAGNOSIS — R413 Other amnesia: Secondary | ICD-10-CM

## 2021-07-13 DIAGNOSIS — R269 Unspecified abnormalities of gait and mobility: Secondary | ICD-10-CM

## 2021-07-13 MED ORDER — GADOBENATE DIMEGLUMINE 529 MG/ML IV SOLN
16.0000 mL | Freq: Once | INTRAVENOUS | Status: AC | PRN
  Administered 2021-07-13: 16 mL via INTRAVENOUS

## 2021-11-14 ENCOUNTER — Encounter: Payer: BC Managed Care – PPO | Admitting: Psychology

## 2021-11-27 ENCOUNTER — Encounter: Payer: BC Managed Care – PPO | Admitting: Psychology

## 2022-01-15 ENCOUNTER — Ambulatory Visit: Payer: BC Managed Care – PPO | Admitting: Neurology

## 2022-03-15 ENCOUNTER — Other Ambulatory Visit: Payer: Self-pay | Admitting: Family Medicine

## 2022-03-15 DIAGNOSIS — Z1231 Encounter for screening mammogram for malignant neoplasm of breast: Secondary | ICD-10-CM

## 2022-04-02 ENCOUNTER — Ambulatory Visit
Admission: RE | Admit: 2022-04-02 | Discharge: 2022-04-02 | Disposition: A | Discharge status: Home / Self Care | Payer: BC Managed Care – PPO | Source: Ambulatory Visit | Attending: Family Medicine | Admitting: Family Medicine

## 2022-04-02 DIAGNOSIS — Z1231 Encounter for screening mammogram for malignant neoplasm of breast: Secondary | ICD-10-CM

## 2022-04-03 ENCOUNTER — Other Ambulatory Visit: Payer: Self-pay | Admitting: Family Medicine

## 2022-04-03 DIAGNOSIS — R928 Other abnormal and inconclusive findings on diagnostic imaging of breast: Secondary | ICD-10-CM

## 2022-04-11 ENCOUNTER — Ambulatory Visit
Admission: RE | Admit: 2022-04-11 | Discharge: 2022-04-11 | Disposition: A | Discharge status: Home / Self Care | Payer: BC Managed Care – PPO | Source: Ambulatory Visit | Attending: Family Medicine | Admitting: Family Medicine

## 2022-04-11 ENCOUNTER — Ambulatory Visit: Payer: BC Managed Care – PPO

## 2022-04-11 DIAGNOSIS — R928 Other abnormal and inconclusive findings on diagnostic imaging of breast: Secondary | ICD-10-CM

## 2022-04-26 ENCOUNTER — Encounter: Payer: BC Managed Care – PPO | Admitting: Psychology

## 2022-05-03 ENCOUNTER — Encounter: Payer: BC Managed Care – PPO | Admitting: Psychology

## 2022-05-03 ENCOUNTER — Encounter: Payer: Self-pay | Admitting: *Deleted

## 2022-05-11 ENCOUNTER — Encounter: Payer: Self-pay | Admitting: Gastroenterology

## 2022-05-11 ENCOUNTER — Ambulatory Visit: Payer: BC Managed Care – PPO | Admitting: Gastroenterology

## 2022-05-11 VITALS — BP 110/70 | HR 80 | Ht 65.5 in | Wt 192.1 lb

## 2022-05-11 DIAGNOSIS — R1031 Right lower quadrant pain: Secondary | ICD-10-CM

## 2022-05-11 DIAGNOSIS — R1011 Right upper quadrant pain: Secondary | ICD-10-CM | POA: Diagnosis not present

## 2022-05-11 DIAGNOSIS — R6881 Early satiety: Secondary | ICD-10-CM | POA: Diagnosis not present

## 2022-05-11 DIAGNOSIS — K219 Gastro-esophageal reflux disease without esophagitis: Secondary | ICD-10-CM

## 2022-05-11 DIAGNOSIS — R1310 Dysphagia, unspecified: Secondary | ICD-10-CM | POA: Diagnosis not present

## 2022-05-11 DIAGNOSIS — R194 Change in bowel habit: Secondary | ICD-10-CM

## 2022-05-11 MED ORDER — DEXLANSOPRAZOLE 60 MG PO CPDR: 60.0000 mg | DELAYED_RELEASE_CAPSULE | Freq: Every day | ORAL | 0 refills | Status: AC

## 2022-05-11 MED ORDER — METOCLOPRAMIDE HCL 5 MG PO TABS: 5.0000 mg | ORAL_TABLET | Freq: Three times a day (TID) | ORAL | 0 refills | Status: AC

## 2022-05-11 NOTE — Patient Instructions (Addendum)
If you are age 64 or older, your body mass index should be between 23-30. Your Body mass index is 31.49 kg/m. If this is out of the aforementioned range listed, please consider follow up with your Primary Care Provider.  If you are age 63 or younger, your body mass index should be between 19-25. Your Body mass index is 31.49 kg/m. If this is out of the aformentioned range listed, please consider follow up with your Primary Care Provider.   ________________________________________________________  Stop Protonix  We have sent the following medications to your pharmacy for you to pick up at your convenience: Reglan 5 mg: Take three times daily before meals Dexilant 60 mg: Take once daily  Please purchase the following medications over the counter and take as directed: Citrucel - Take once daily ______________________________________________________________________________  You have been scheduled for a CT scan of the abdomen and pelvis at [facility], 1st floor Radiology. You are scheduled on Friday, 7-14  at 8:30am. You should arrive 15 minutes prior to your appointment time for registration.  The solution may taste better if refrigerated, but do NOT add ice or any other liquid to this solution. Shake well before drinking.   Please follow the written instructions below on the day of your exam:   1) Do not eat anything after 4:30am (4 hours prior to your test)   2) Drink 1 bottle of contrast @ 6:30am (2 hours prior to your exam)  Remember to shake well before drinking and do NOT pour over ice.     Drink 1 bottle of contrast @ 7:30am (1 hour prior to your exam)   You may take any medications as prescribed with a small amount of water, if necessary. If you take any of the following medications: METFORMIN, GLUCOPHAGE, GLUCOVANCE, AVANDAMET, RIOMET, FORTAMET, [place] MET, JANUMET, GLUMETZA or METAGLIP, you MAY be asked to HOLD this medication 48 hours AFTER the exam.   The purpose of  you drinking the oral contrast is to aid in the visualization of your intestinal tract. The contrast solution may cause some diarrhea. Depending on your individual set of symptoms, you may also receive an intravenous injection of x-ray contrast/dye. Plan on being at [facility] for 45 minutes or longer, depending on the type of exam you are having performed.   If you have any questions regarding your exam or if you need to reschedule, you may call [facility] at [phone] between the hours of 8:00 am and 5:00 pm, Monday-Friday.   Thank you for entrusting me with your care and for choosing [facility], Dr. Brentwood Cellar

## 2022-05-11 NOTE — Progress Notes (Signed)
HPI :  66 year old female here for follow-up visit for multiple gastrointestinal complaints.  I last saw her for her colonoscopy in August 2021.  I have seen her for reflux in the past.  She states she has had worsening symptoms since have last seen her.  She has a lot of pyrosis and discomfort in her lower chest, as well as belching.  She has occasional dysphagia at times, often with eating breads.  Pantoprazole 40 mg is her current regimen taken once daily.  She states on this regimen she has frequent breakthrough of symptoms.  She has tried it twice daily before and it did not really help much.  She has cut back on the amount of soda she is drank, down to just 1/day, she states it has not helped much.  She is trying to avoid coffee and other food triggers.  She recalls being on omeprazole in the past as well which did not really help her.  She feels nauseated frequently but she does not vomit.  She does have a lot of early satiety which bothers her.  She states she thinks she is having some "gallbladder pain".  It depends on what she eats, she can sometimes have upper abdominal discomfort in the right upper quadrant every few days, although sometimes also can happen without clear food triggers.    She also is having right lower quadrant pain.  This is been going on for a few weeks now.  Can come and go without any clear triggers.  She does not feel it is related to her eating, not related to her bowel movements either.  She is had brown stools without blood, frequency and form can alter..  She has some hemorrhoids and states taking gummy fibers can sometimes help.  This pain lasts hours at a time, present most days of the week.  She is not taking any meloxicam (listed in med list), not taking NSAIDs routinely.  She is under a lot of stress and recently had her Lexapro increased.  Recall she has had a few endoscopies in the past.  I previously dilated her esophagus for dysphagia in 2020 and did not  provide any benefit.  She had a barium swallow which showed some nonspecific dysmotility.  I had recommended esophageal manometry given her persistent dysphagia as well as 24-hour pH test, she states she could not tolerate these and declined exams.      Prior workup: EGD 07/11/2017 - 2cm HH, normal esophagus, biopsies taken to rule out EoE, empiric dilation done to 25m with small wrent at UES, mild gastritis   [place]Q 05/13/19 - normal   Colonoscopy 04/25/2010 - normal   EGD 06/10/19 - 2cm HH, nl esophagus, empiric dilation performed, benign gastric polyps, H pylori negative, normal duodenum.    Barium swallow 09/09/19 - no stricture, nonspecific dysmotility     Colonoscopy 06/30/2020: The perianal and digital rectal examinations were normal. - A 3 mm polyp was found in the sigmoid colon. The polyp was sessile. The polyp was removed with a cold snare. Resection and retrieval were complete. - A few small-mouthed diverticula were found in the ascending colon. - Internal hemorrhoids were found during retroflexion. The hemorrhoids were small. - The exam was otherwise without abnormality.  Surgical [P], colon, sigmoid - HYPERPLASTIC POLYP. - NO DYSPLASIA OR MALIGNANCY   Echo 11/16/2019: EF 60-65%, grade I DD  Past Medical History:  Diagnosis Date   Allergy    Anxiety    Common  migraine with intractable migraine 04/22/2018   Diverticulosis    GERD (gastroesophageal reflux disease)    Hearing loss    Hiatal hernia    Hyperlipidemia    Internal hemorrhoids    Migraine headache    Paresthesia of left lower extremity 02/2021   Right fibular fracture 05/2017   Dr. Berenice Primas   Tinnitus, bilateral      Past Surgical History:  Procedure Laterality Date   ADENOIDECTOMY     COLONOSCOPY     ESOPHAGEAL MANOMETRY N/A 08/03/2019   Procedure: ESOPHAGEAL MANOMETRY (EM);  Surgeon: Yetta Flock, MD;  Location: [facility];  Service: Gastroenterology;  Laterality: N/A;  Aborted, pt unable  to tolerate   right ankle surgery w/plate and screws  96/22/2979   UPPER GASTROINTESTINAL ENDOSCOPY     several   Family History  Problem Relation Age of Onset   Stroke Mother    Migraines Mother    Congestive Heart Failure Mother    Dementia Mother    Dementia Father    Migraines Sister    Colon cancer Neg Hx    Esophageal cancer Neg Hx    Pancreatic cancer Neg Hx    Liver cancer Neg Hx    Rectal cancer Neg Hx    Stomach cancer Neg Hx    Colon polyps Neg Hx    Social History   Tobacco Use   Smoking status: Never   Smokeless tobacco: Never  Vaping Use   Vaping Use: Never used  Substance Use Topics   Alcohol use: Yes    Alcohol/week: 5.0 standard drinks of alcohol    Types: 5 Glasses of wine per week   Drug use: No   Current Outpatient Medications  Medication Sig Dispense Refill   calcium carbonate (OS-CAL) 600 MG TABS tablet Take 1 tablet by mouth daily.     cholecalciferol (VITAMIN D) 1000 units tablet Take 1 tablet by mouth daily.     dexlansoprazole (DEXILANT) 60 MG capsule Take 1 capsule (60 mg total) by mouth daily. 30 capsule 0   escitalopram (LEXAPRO) 10 MG tablet Take 10 mg by mouth daily.     hydrocortisone (ANUSOL-HC) 2.5 % rectal cream Place 1 Application rectally as needed.     metoCLOPramide (REGLAN) 5 MG tablet Take 1 tablet (5 mg total) by mouth 3 (three) times daily before meals. 90 tablet 0   Multiple Vitamins-Minerals (MULTIVITAL PO) Take 1 tablet by mouth daily.     pravastatin (PRAVACHOL) 20 MG tablet Take 40 mg by mouth daily.     meloxicam (MOBIC) 15 MG tablet TK 1 T PO QD WAC (Patient not taking: Reported on 05/11/2022)     rizatriptan (MAXALT-MLT) 10 MG disintegrating tablet Take 10 mg by mouth as needed. (Patient not taking: Reported on 05/11/2022)     Current Facility-Administered Medications  Medication Dose Route Frequency Provider Last Rate Last Admin   0.9 %  sodium chloride infusion  500 mL Intravenous Once Kalenna Millett, Carlota Raspberry, MD        Allergies  Allergen Reactions   Bee Venom Anaphylaxis and Swelling   Metronidazole Nausea And Vomiting    Other reaction(s): nausea   Atorvastatin     Other reaction(s): myalgias   Cefuroxime Axetil Nausea And Vomiting   Sulfonamide Derivatives     REACTION: rash     Review of Systems: All systems reviewed and negative except where noted in HPI.   Lab Results  Component Value Date   WBC 6.7 04/20/2019  HGB 14.3 04/20/2019   HCT 44.2 04/20/2019   MCV 91.1 04/20/2019   PLT 258 04/20/2019    Lab Results  Component Value Date   CREATININE 0.70 12/01/2019   BUN 15 12/01/2019   NA 140 12/01/2019   K 4.2 12/01/2019   CL 101 12/01/2019   CO2 25 12/01/2019    Lab Results  Component Value Date   ALT 16 04/20/2019   AST 24 04/20/2019   ALKPHOS 73 04/20/2019   BILITOT 0.6 04/20/2019     Physical Exam: BP 110/70 (BP Location: Left Arm, Patient Position: Sitting, Cuff Size: Normal)   Pulse 80   Ht 5' 5.5" (1.664 m)   Wt 192 lb 2 oz (87.1 kg)   BMI 31.49 kg/m  Constitutional: Pleasant,well-developed, female in no acute distress. Abdominal: Soft, nondistended, nontender. There are no masses palpable.  Extremities: no edema Neurological: Alert and oriented to person place and time. Psychiatric: Normal mood and affect. Behavior is normal.   ASSESSMENT AND PLAN: 66 year old female here for reassessment of following:  GERD Dysphagia Early satiety Right upper quadrant pain Right lower quadrant pain Altered bowel habits  Multitude of symptoms as outlined above.  Regarding her upper tract symptoms, she has had EGDs in the past for her dysphagia with empiric dilation, did not help.  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  She unfortunately could not tolerate manometry.  With her early satiety, possible she could have gastroparesis which can make her reflux worse.  We discussed options for  evaluating this with GES versus empiric trial of Reglan.  I discussed Reglan with her, potential risks, at low-dose for short-term trial would seem to be safe.  She wants to try Reglan 5 mg 3 times daily prior to meals to see if this will help.  We discussed her prior PPI use which has not provided good control for her pyrosis.  We will try switching her to Dexilant 60 mg daily for 1 month if insurance is able to cover this, and stop Protonix.  If she she does not get any benefit from this, question if reflux is causing her symptoms, could consider EGD with Bravo study if she cannot tolerate a traditional pH test.  She has had a right upper quadrant ultrasound a few years ago which showed no gallstones or problem with her gallbladder.  In regards to her right lower quadrant pain which has been persistent for weeks now and continues to bother her without clear triggers, recommended CT scan abdomen pelvis with contrast to further evaluate.  We will await this imaging first.  If no clear cause for her right upper quadrant pain there which persists despite management with Dexilant and Reglan as above, may consider HIDA scan or a repeat endoscopy.  Otherwise recommend fiber supplement daily such as Citrucel to provide some regularity for her bowel habits without causing bloating.  We discussed role of anxiety could be making her symptoms worse, she had her Lexapro increased recently and we will await her response  Plan: - stop protonix - start trial of Dexilant '60mg'$  / day for 1 month - start trial of Reglan '5mg'$  TID - prior to meals - CT abdomen / pelvis with contrast - Citrucel once daily - consider EGD and or HIDA scan if no better - await course on higher dosing of lexapro  I spent 40 minutes of time, including in depth chart review, face-to-face time with the  patient, coordinating care, and documentation of this encounter.  Jolly Mango, MD [facility]

## 2022-05-17 ENCOUNTER — Ambulatory Visit: Payer: BC Managed Care – PPO | Admitting: Neurology

## 2022-05-25 ENCOUNTER — Ambulatory Visit (HOSPITAL_COMMUNITY)
Admission: RE | Admit: 2022-05-25 | Discharge: 2022-05-25 | Disposition: A | Discharge status: Home / Self Care | Payer: BC Managed Care – PPO | Source: Ambulatory Visit | Attending: Gastroenterology | Admitting: Gastroenterology

## 2022-05-25 ENCOUNTER — Encounter (HOSPITAL_COMMUNITY): Payer: Self-pay

## 2022-05-25 DIAGNOSIS — R6881 Early satiety: Secondary | ICD-10-CM

## 2022-05-25 DIAGNOSIS — R194 Change in bowel habit: Secondary | ICD-10-CM | POA: Diagnosis present

## 2022-05-25 DIAGNOSIS — R1011 Right upper quadrant pain: Secondary | ICD-10-CM

## 2022-05-25 DIAGNOSIS — R1031 Right lower quadrant pain: Secondary | ICD-10-CM | POA: Diagnosis present

## 2022-05-25 DIAGNOSIS — K219 Gastro-esophageal reflux disease without esophagitis: Secondary | ICD-10-CM

## 2022-05-25 DIAGNOSIS — R1310 Dysphagia, unspecified: Secondary | ICD-10-CM | POA: Diagnosis present

## 2022-05-25 MED ORDER — IOHEXOL 300 MG/ML  SOLN
100.0000 mL | Freq: Once | INTRAMUSCULAR | Status: AC | PRN
  Administered 2022-05-25: 100 mL via INTRAVENOUS

## 2022-05-25 MED ORDER — SODIUM CHLORIDE (PF) 0.9 % IJ SOLN
INTRAMUSCULAR | Status: AC
  Filled 2022-05-25: qty 50

## 2022-05-31 ENCOUNTER — Telehealth: Payer: Self-pay

## 2022-05-31 ENCOUNTER — Other Ambulatory Visit (HOSPITAL_COMMUNITY): Payer: Self-pay

## 2022-05-31 NOTE — Telephone Encounter (Signed)
Patient Advocate Encounter   Received notification from [facility] that prior authorization is required for Dexlansoprazole '60MG'$ .  Test billing indicates that this insurance prefers esomeprazole / lansoprazole. Both show a $5 copay (if filled with [facility] pharmacies). Please send in new script if either of these options are applicable.  Please message Rx Prior Auth Team if [place] paperwork needs to be started.  Clista Bernhardt, CPhT Pharmacy Patient Advocate Specialist [place] Patient Advocate Team Phone: [phone]   Fax: [phone]

## 2022-06-01 ENCOUNTER — Other Ambulatory Visit (HOSPITAL_COMMUNITY): Payer: Self-pay

## 2022-06-01 NOTE — Telephone Encounter (Signed)
Patient Advocate Encounter  Received notification from [facility] that prior authorization for DEXLANSOPRAZOLE '60MG'$  is required.   PA submitted on 7.21.23 Key [id number] Status is pending    Luciano Cutter, CPhT Patient Advocate Phone: [phone]

## 2022-06-01 NOTE — Telephone Encounter (Signed)
Called and spoke with patient via phone to verify medications taken. Pt reports she has tried/failed tums, esomeprazole, omeprazole, and pantoprazole.   Tums- uses as needed to help symptoms Esomeprazole- inadequate response Omeprazole- change in therapy  Pantoprazole- no longer effective

## 2022-06-05 NOTE — Telephone Encounter (Signed)
Patient Advocate Encounter  Prior Authorization for Dexlansoprazole has been approved.   Effective dates: 06/01/2022 through 06/02/2023  Clista Bernhardt, CPhT Pharmacy Patient Advocate Specialist [place] Patient Advocate Team Phone: [phone]   Fax: [phone]

## 2023-05-14 ENCOUNTER — Other Ambulatory Visit: Payer: Self-pay | Admitting: Family Medicine

## 2023-05-14 DIAGNOSIS — Z1231 Encounter for screening mammogram for malignant neoplasm of breast: Secondary | ICD-10-CM

## 2023-05-20 ENCOUNTER — Ambulatory Visit: Admission: RE | Admit: 2023-05-20 | Payer: BC Managed Care – PPO | Source: Ambulatory Visit

## 2023-05-20 DIAGNOSIS — Z1231 Encounter for screening mammogram for malignant neoplasm of breast: Secondary | ICD-10-CM

## 2023-07-19 IMAGING — MG MM DIGITAL SCREENING BILAT W/ TOMO AND CAD
8 series · 8 of 24 positions shown · non-contrast
Comparison: Previous exam(s).

CLINICAL DATA: Screening.

EXAM:
DIGITAL SCREENING BILATERAL MAMMOGRAM WITH TOMOSYNTHESIS AND CAD
TECHNIQUE: Bilateral screening digital craniocaudal and mediolateral oblique
mammograms were obtained. Bilateral screening digital breast
tomosynthesis was performed. The images were evaluated with
computer-aided detection.

[R MLO synth-2D]
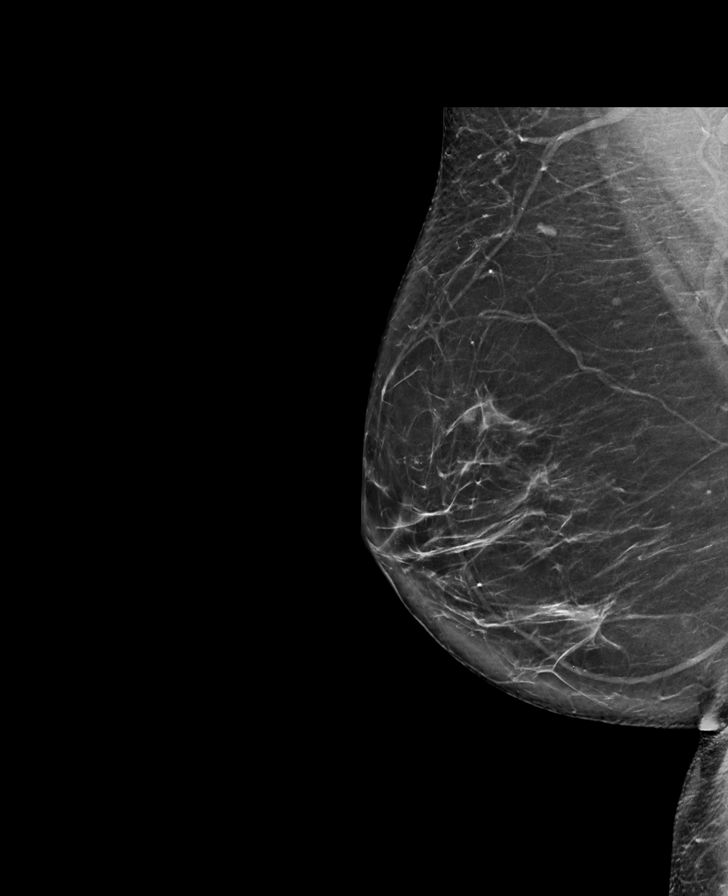

[L MLO synth-2D]
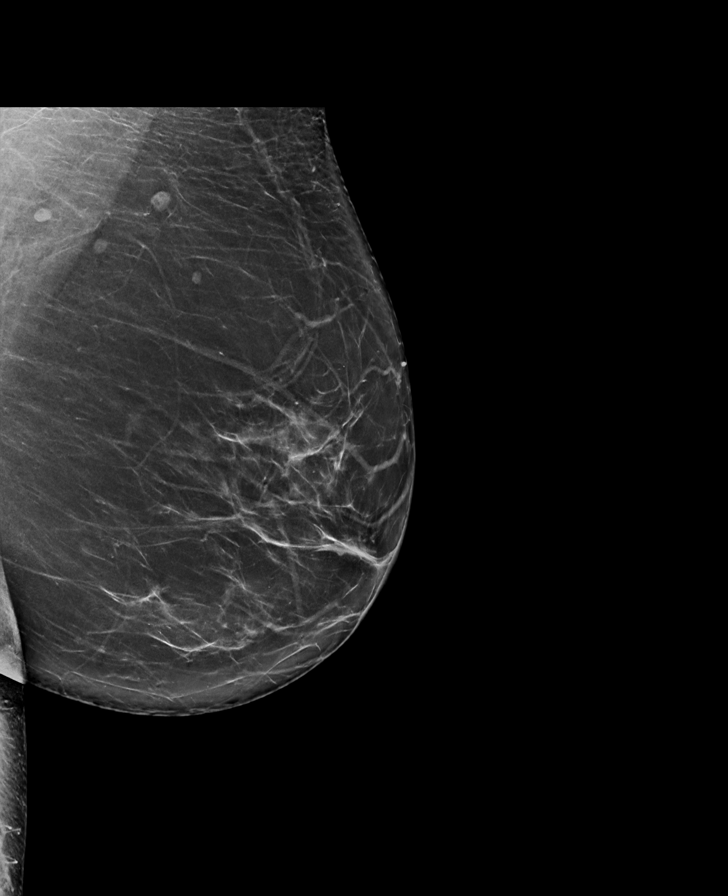

[L CC synth-2D]
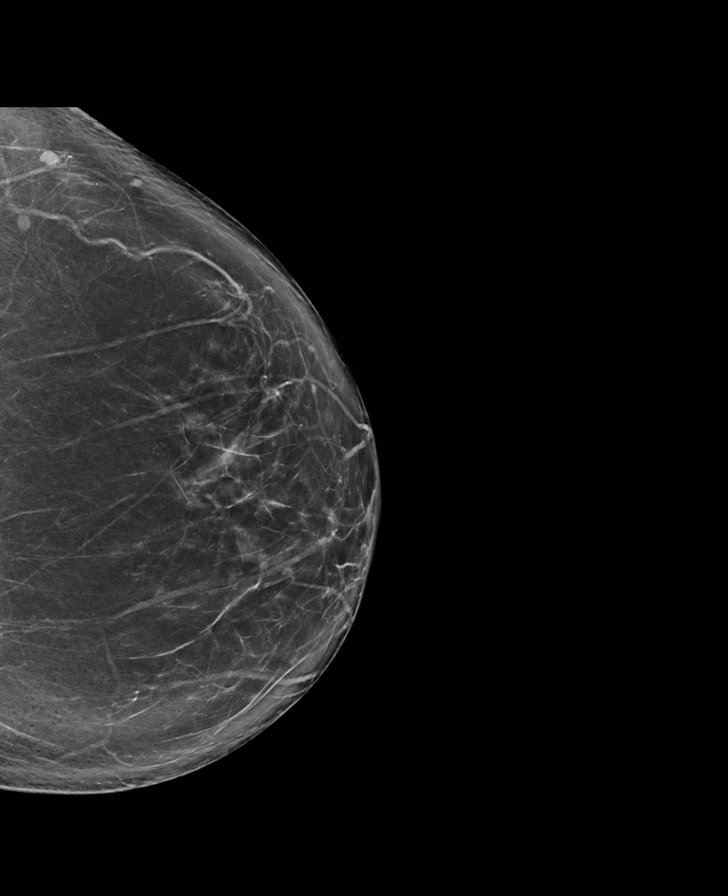

[R CC synth-2D]
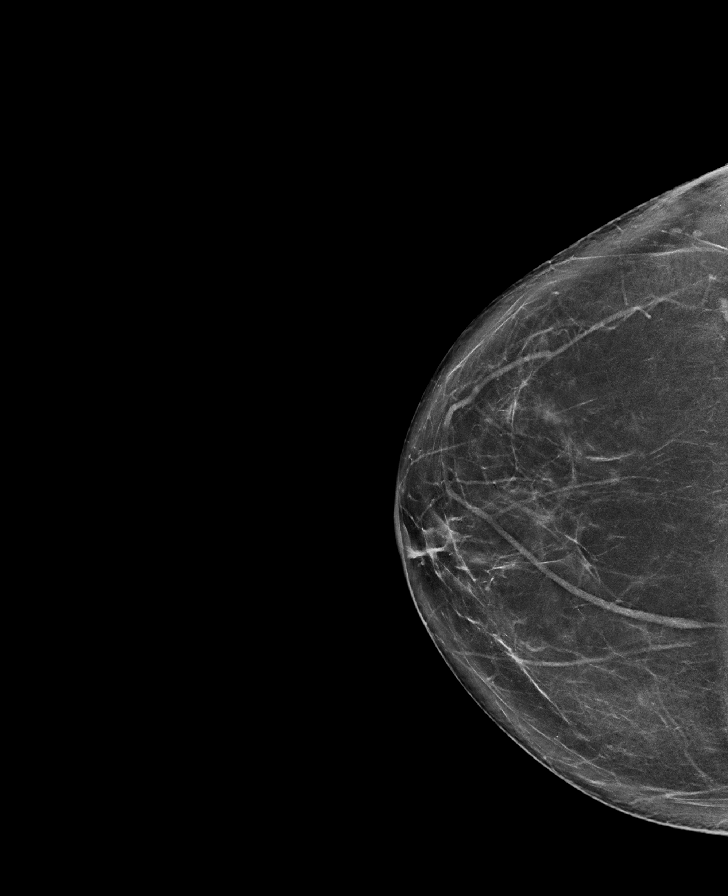

[L CC tomo · tomo slice 43/84.0]
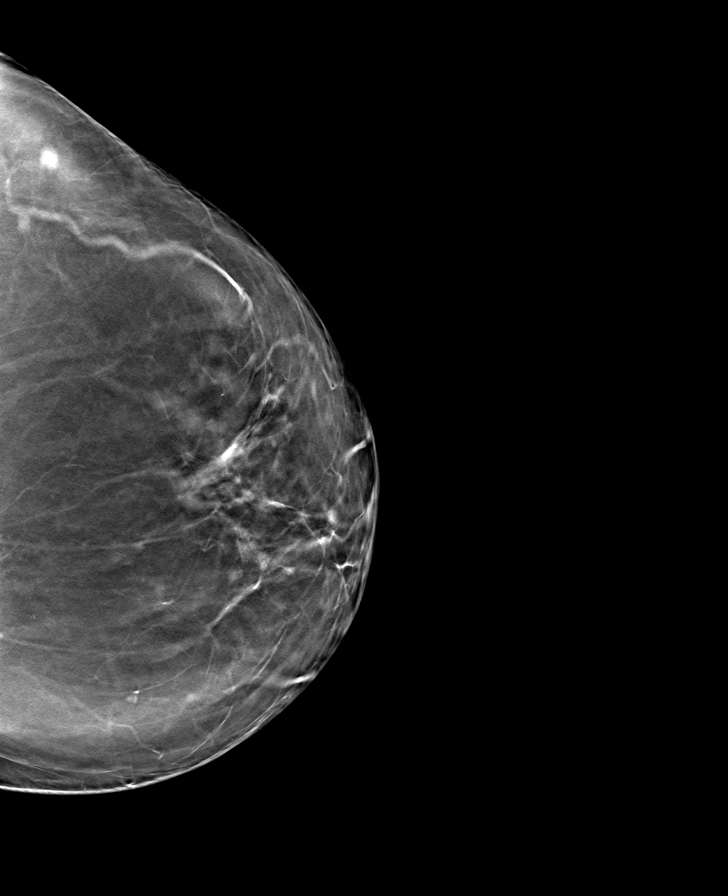

[R MLO tomo · tomo slice 43/85.0]
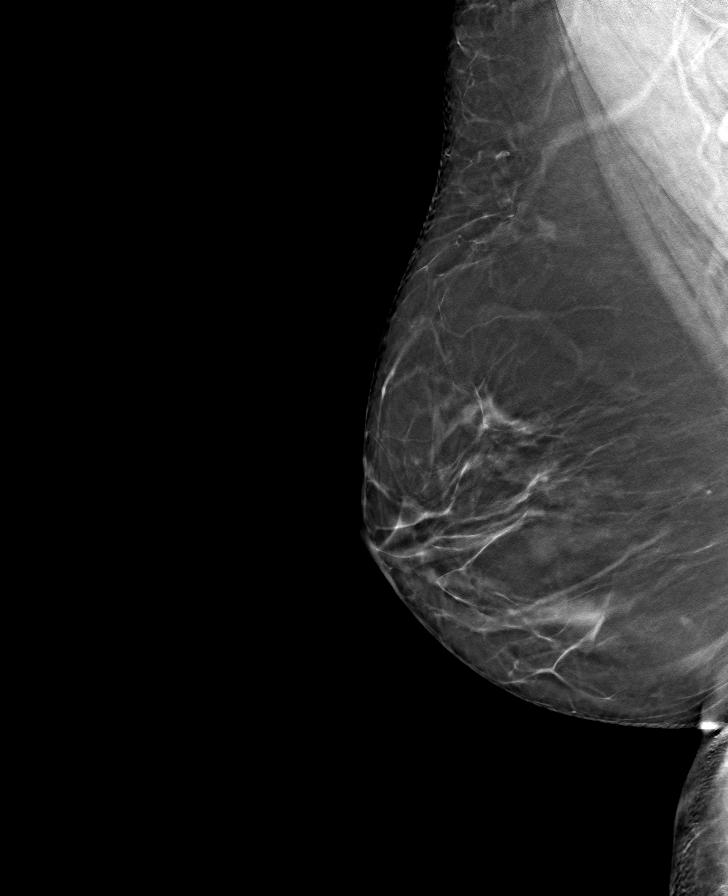

[R CC tomo · tomo slice 41/82.0]
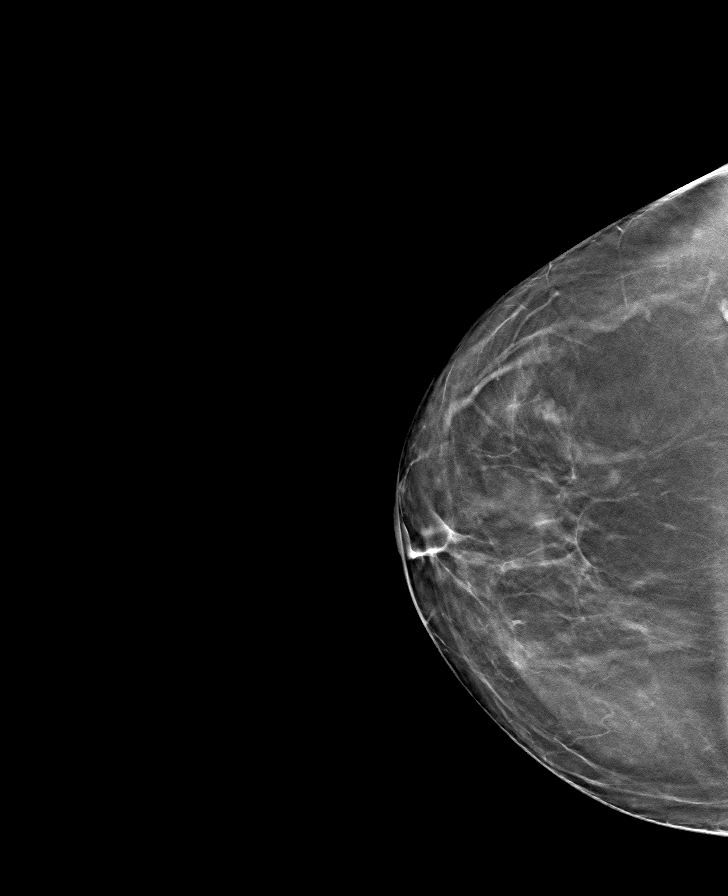

[L MLO tomo · tomo slice 45/89.0]
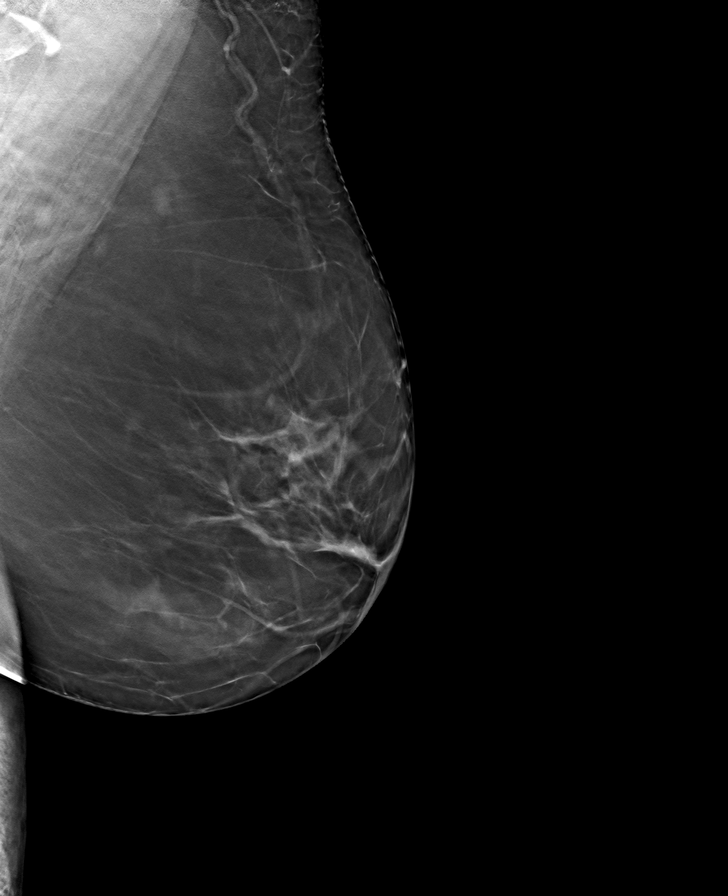

[8 of 24 positions shown; findings below may reference images not displayed]

ACR Breast Density Category b: There are scattered areas of
fibroglandular density.
FINDINGS: In the right breast, a possible asymmetry warrants further
evaluation. In the left breast, no findings suspicious for
malignancy.
IMPRESSION: Further evaluation is suggested for possible asymmetry in the right
breast.

RECOMMENDATION:
Diagnostic mammogram and possibly ultrasound of the right breast.
(Code:BU-C-IIK)

The patient will be contacted regarding the findings, and additional
imaging will be scheduled.

BI-RADS CATEGORY  0: Incomplete. Need additional imaging evaluation
and/or prior mammograms for comparison.

## 2024-05-05 ENCOUNTER — Other Ambulatory Visit: Payer: Self-pay | Admitting: Family Medicine

## 2024-05-05 DIAGNOSIS — Z1231 Encounter for screening mammogram for malignant neoplasm of breast: Secondary | ICD-10-CM

## 2024-05-20 ENCOUNTER — Ambulatory Visit
Admission: RE | Admit: 2024-05-20 | Discharge: 2024-05-20 | Disposition: A | Discharge status: Home / Self Care | Payer: Self-pay | Source: Ambulatory Visit | Attending: Family Medicine | Admitting: Family Medicine

## 2024-05-20 DIAGNOSIS — Z1231 Encounter for screening mammogram for malignant neoplasm of breast: Secondary | ICD-10-CM

## 2024-09-15 ENCOUNTER — Other Ambulatory Visit (HOSPITAL_BASED_OUTPATIENT_CLINIC_OR_DEPARTMENT_OTHER): Payer: Self-pay | Admitting: Family Medicine

## 2024-09-15 DIAGNOSIS — E2839 Other primary ovarian failure: Secondary | ICD-10-CM

## 2024-11-02 ENCOUNTER — Ambulatory Visit (HOSPITAL_BASED_OUTPATIENT_CLINIC_OR_DEPARTMENT_OTHER)
Admission: RE | Admit: 2024-11-02 | Discharge: 2024-11-02 | Disposition: A | Discharge status: Home / Self Care | Source: Ambulatory Visit | Attending: Family Medicine | Admitting: Family Medicine

## 2024-11-02 DIAGNOSIS — E2839 Other primary ovarian failure: Secondary | ICD-10-CM | POA: Diagnosis present
